# Patient Record
Sex: Female | Born: 1945 | Race: Black or African American | Hispanic: No | Smoking: Current every day smoker
Health system: Southern US, Community
[De-identification: ages and names within clinical notes are randomized; demographics above are authoritative.]

## PROBLEM LIST (undated history)

## (undated) DIAGNOSIS — I10 Essential (primary) hypertension: Secondary | ICD-10-CM

## (undated) DIAGNOSIS — H269 Unspecified cataract: Secondary | ICD-10-CM

## (undated) DIAGNOSIS — H35039 Hypertensive retinopathy, unspecified eye: Secondary | ICD-10-CM

## (undated) HISTORY — DX: Unspecified cataract: H26.9

## (undated) HISTORY — PX: TONSILLECTOMY: SUR1361

## (undated) HISTORY — PX: ABDOMINAL HYSTERECTOMY: SHX81

## (undated) HISTORY — DX: Hypertensive retinopathy, unspecified eye: H35.039

## (undated) HISTORY — PX: CYSTECTOMY: SUR359

---

## 2017-05-17 ENCOUNTER — Ambulatory Visit
Admit: 2017-05-17 | Discharge: 2017-05-17 | Payer: PRIVATE HEALTH INSURANCE | Attending: Family Medicine | Primary: Family Medicine

## 2017-05-17 DIAGNOSIS — I10 Essential (primary) hypertension: Secondary | ICD-10-CM

## 2017-05-17 MED ORDER — LISINOPRIL 20 MG TAB
20 mg | ORAL_TABLET | Freq: Every day | ORAL | 1 refills | Status: DC
Start: 2017-05-17 — End: 2017-08-17

## 2017-05-17 NOTE — Patient Instructions (Addendum)
Heart-Healthy Diet: Care Instructions  Your Care Instructions    A heart-healthy diet has lots of vegetables, fruits, nuts, beans, and whole grains, and is low in salt. It limits foods that are high in saturated fat, such as meats, cheeses, and fried foods. It may be hard to change your diet, but even small changes can lower your risk of heart attack and heart disease.  Follow-up care is a key part of your treatment and safety. Be sure to make and go to all appointments, and call your doctor if you are having problems. It's also a good idea to know your test results and keep a list of the medicines you take.  How can you care for yourself at home?  Watch your portions  ?? Learn what a serving is. A "serving" and a "portion" are not always the same thing. Make sure that you are not eating larger portions than are recommended. For example, a serving of pasta is ?? cup. A serving size of meat is 2 to 3 ounces. A 3-ounce serving is about the size of a deck of cards. Measure serving sizes until you are good at "eyeballing" them. Keep in mind that restaurants often serve portions that are 2 or 3 times the size of one serving.  ?? To keep your energy level up and keep you from feeling hungry, eat often but in smaller portions.  ?? Eat only the number of calories you need to stay at a healthy weight. If you need to lose weight, eat fewer calories than your body burns (through exercise and other physical activity).  Eat more fruits and vegetables  ?? Eat a variety of fruit and vegetables every day. Dark green, deep orange, red, or yellow fruits and vegetables are especially good for you. Examples include spinach, carrots, peaches, and berries.  ?? Keep carrots, celery, and other veggies handy for snacks. Buy fruit that is in season and store it where you can see it so that you will be tempted to eat it.  ?? Cook dishes that have a lot of veggies in them, such as stir-fries and soups.  Limit saturated and trans fat   ?? Read food labels, and try to avoid saturated and trans fats. They increase your risk of heart disease. Trans fat is found in many processed foods such as cookies and crackers.  ?? Use olive or canola oil when you cook. Try cholesterol-lowering spreads, such as Benecol or Take Control.  ?? Bake, broil, grill, or steam foods instead of frying them.  ?? Choose lean meats instead of high-fat meats such as hot dogs and sausages. Cut off all visible fat when you prepare meat.  ?? Eat fish, skinless poultry, and meat alternatives such as soy products instead of high-fat meats. Soy products, such as tofu, may be especially good for your heart.  ?? Choose low-fat or fat-free milk and dairy products.  Eat fish  ?? Eat at least two servings of fish a week. Certain fish, such as salmon and tuna, contain omega-3 fatty acids, which may help reduce your risk of heart attack.  Eat foods high in fiber  ?? Eat a variety of grain products every day. Include whole-grain foods that have lots of fiber and nutrients. Examples of whole-grain foods include oats, whole wheat bread, and brown rice.  ?? Buy whole-grain breads and cereals, instead of white bread or pastries.  Limit salt and sodium  ?? Limit how much salt and sodium you eat to help   lower your blood pressure.  ?? Taste food before you salt it. Add only a little salt when you think you need it. With time, your taste buds will adjust to less salt.  ?? Eat fewer snack items, fast foods, and other high-salt, processed foods. Check food labels for the amount of sodium in packaged foods.  ?? Choose low-sodium versions of canned goods (such as soups, vegetables, and beans).  Limit sugar  ?? Limit drinks and foods with added sugar. These include candy, desserts, and soda pop.  Limit alcohol  ?? Limit alcohol to no more than 2 drinks a day for men and 1 drink a day for women. Too much alcohol can cause health problems.  When should you call for help?   Watch closely for changes in your health, and be sure to contact your doctor if:  ?? ?? You would like help planning heart-healthy meals.   Where can you learn more?  Go to [employer].  Enter V137 in the search box to learn more about "Heart-Healthy Diet: Care Instructions."  Current as of: August 01, 2016  Content Version: 11.9  ?? 2006-2018 [employer]. Care instructions adapted under license by [employer] (which disclaims liability or warranty for this information). If you have questions about a medical condition or this instruction, always ask your healthcare professional. [employer] disclaims any warranty or liability for your use of this information.         Saline Nasal Washes: Care Instructions  Your Care Instructions  Saline nasal washes help keep the nasal passages open by washing out thick or dried mucus. This simple remedy can help relieve symptoms of allergies, sinusitis, and colds. It also can make the nose feel more comfortable by keeping the mucous membranes moist. You may notice a little burning sensation in your nose the first few times you use the solution, but this usually gets better in a few days.  Follow-up care is a key part of your treatment and safety. Be sure to make and go to all appointments, and call your doctor if you are having problems. It's also a good idea to know your test results and keep a list of the medicines you take.  How can you care for yourself at home?  ?? You can buy premixed saline solution in a squeeze bottle or other sinus rinse products at a drugstore. Read and follow the instructions on the label.  ?? You also can make your own saline solution by adding 1 teaspoon of salt and 1 teaspoon of baking soda to 2 cups of distilled water.  ?? If you use a homemade solution, pour a small amount into a clean bowl. Using a rubber bulb syringe, squeeze the syringe and place the tip in the  salt water. Pull a small amount of the salt water into the syringe by relaxing your hand.  ?? Sit down with your head tilted slightly back. Do not lie down. Put the tip of the bulb syringe or the squeeze bottle a little way into one of your nostrils. Gently drip or squirt a few drops into the nostril. Repeat with the other nostril. Some sneezing and gagging are normal at first.  ?? Gently blow your nose.  ?? Wipe the syringe or bottle tip clean after each use.  ?? Repeat this 2 or 3 times a day.  ?? Use nasal washes gently if you have nosebleeds often.  When should you call for help?  Watch closely for changes  in your health, and be sure to contact your doctor if:  ?? ?? You often get nosebleeds.   ?? ?? You have problems doing the nasal washes.   Where can you learn more?  Go to [employer].  Enter B784 in the search box to learn more about "Saline Nasal Washes: Care Instructions."  Current as of: April 06, 2016  Content Version: 11.9  ?? 2006-2018 [employer]. Care instructions adapted under license by [employer] (which disclaims liability or warranty for this information). If you have questions about a medical condition or this instruction, always ask your healthcare professional. [employer] disclaims any warranty or liability for your use of this information.

## 2017-05-17 NOTE — Progress Notes (Signed)
[facility]  _______________________________________  Laurie Marseilles, MD                 [address]        [place]. Laurie [place], MD                     [place], [place] [address]                                                                                    Phone: [phone]                                                                                    Fax: [phone]    Laurie Richard is a 72 y.o. female who is seen for evaluation of   Chief Complaint   Patient presents with   ??? Establish Care         HPI:     New pt here to establish care, she is here because her new insurer assigned her to me:    HTN: She is on Prinzide 20/12.5. She is not close to being controlled on this regimen. She states she is not taking the whole pill. Has been taking half a pill. He states she will see SBP in the 90s on the higher dose of  20/12.5. She makes her own food, does not use much salt at all. She states her BP is always high at the doctor's office. She states she was 137/94 after taking her medication this morning. She eats a lot of greens and stays away from red meat.     BP Readings from Last 3 Encounters:   05/17/17 (!) 164/102     Obesity: She was as heavy as #170, used to work on her feet 12-16 hours a day but is now retired, now is a Programmer, multimedia.   Wt Readings from Last 3 Encounters:   05/17/17 156 lb (70.8 kg)       HM:  Colo: She would like cologuard  Mammo: She states she has no family history of breast cancer in mother and sister      Review of Systems:  Review of Systems   Constitutional: Negative for chills and fever.   HENT: Negative for ear pain and hearing loss.    Eyes: Negative for blurred vision and double vision.   Respiratory: Negative for cough and shortness of breath.    Cardiovascular: Negative for chest pain and palpitations.   Gastrointestinal: Negative for abdominal pain, blood in stool, melena, nausea and vomiting.    Genitourinary: Negative for dysuria, frequency, hematuria and urgency.   Musculoskeletal: Negative for back pain and joint pain.   Skin: Negative for itching and rash.   Neurological: Negative for dizziness, tingling and headaches.   Psychiatric/Behavioral: Negative for depression and  suicidal ideas.       History:  No past medical history on file.    Past Surgical History:   Procedure Laterality Date   ??? HX ANKLE FRACTURE TX     ??? HX BILATERAL SALPINGO-OOPHORECTOMY     ??? HX HYSTERECTOMY     ??? HX TONSILLECTOMY         Family History   Problem Relation Age of Onset   ??? Hypertension Mother    ??? Heart Disease Mother    ??? No Known Problems Father    ??? Hypertension Sister        Social History     Tobacco Use   ??? Smoking status: Never Smoker   ??? Smokeless tobacco: Never Used   Substance Use Topics   ??? Alcohol use: Not Currently     Frequency: Never       Allergies   Allergen Reactions   ??? Opioids - Morphine Analogues Unknown (comments)     Had major altered state, felt like she was going to have a stroke       Vitals:  Visit Vitals  BP (!) 164/102   Ht 5' (1.524 m)   Wt 156 lb (70.8 kg)   BMI 30.47 kg/m??       Physical Exam:  Physical Exam   Constitutional: She is oriented to person, place, and time. She appears well-developed and well-nourished. No distress.   HENT:   Head: Normocephalic and atraumatic.   Mouth/Throat: Oropharynx is clear and moist.   Pale swollen nasal turbinates  +PND   Eyes: Pupils are equal, round, and reactive to light. Conjunctivae and EOM are normal.   Neck: Normal range of motion. Neck supple.   Cardiovascular: Normal rate, regular rhythm and normal heart sounds. Exam reveals no gallop and no friction rub.   No murmur heard.  Pulmonary/Chest: Effort normal and breath sounds normal. No respiratory distress. She has no wheezes. She has no rales.   Abdominal: Soft. Bowel sounds are normal. She exhibits no distension and no mass. There is no tenderness. There is no rebound and no guarding.    Musculoskeletal: Normal range of motion. She exhibits no edema or tenderness.   Neurological: She is alert and oriented to person, place, and time. She has normal reflexes. No cranial nerve deficit. Coordination normal.   Skin: Skin is warm and dry. No rash noted. She is not diaphoretic. No erythema.   Well healed scare medial L ankle   Psychiatric: She has a normal mood and affect. Her behavior is normal.   Nursing note and vitals reviewed.      Assessment and Plan:   1. Essential hypertension  She is very against diuretics. We will increase to  lisinopril only and then recehck her in a few weeks.   - lisinopril (PRINIVIL, ZESTRIL) 20 mg tablet; Take 1 Tab by mouth daily.  Dispense: 90 Tab; Refill: 1  - METABOLIC PANEL, COMPREHENSIVE  - TSH REFLEX TO T4    2. Screening mammogram, encounter for    - MAM 3D TOMO W MAMMO BI SCREENING INCL CAD; Future    3. Screen for colon cancer    - COLOGUARD TEST (FECAL DNA COLORECTAL CANCER SCREENING)    4. Allergic rhinitis, unspecified seasonality, unspecified trigger  Continue naturopathic remedies including Neti Pot.      5. Dysfunction of both eustachian tubes  As above, need better allergy control.     FU 4w for BP check and to recheck  kidneys      Roberts Gaudy, MD

## 2017-05-18 LAB — METABOLIC PANEL, COMPREHENSIVE
A-G Ratio: 1.5 (ref 1.2–2.2)
ALT (SGPT): 18 IU/L (ref 0–32)
AST (SGOT): 26 IU/L (ref 0–40)
Albumin: 4.3 g/dL (ref 3.5–4.8)
Alk. phosphatase: 90 IU/L (ref 39–117)
BUN/Creatinine ratio: 15 (ref 12–28)
BUN: 15 mg/dL (ref 8–27)
Bilirubin, total: 0.9 mg/dL (ref 0.0–1.2)
CO2: 25 mmol/L (ref 20–29)
Calcium: 9.6 mg/dL (ref 8.7–10.3)
Chloride: 99 mmol/L (ref 96–106)
Creatinine: 0.99 mg/dL (ref 0.57–1.00)
GFR est AA: 66 mL/min/{1.73_m2} (ref >59)
GFR est non-AA: 57 mL/min/{1.73_m2} — ABNORMAL LOW (ref >59)
GLOBULIN, TOTAL: 2.8 g/dL (ref 1.5–4.5)
Glucose: 73 mg/dL (ref 65–99)
Potassium: 4 mmol/L (ref 3.5–5.2)
Protein, total: 7.1 g/dL (ref 6.0–8.5)
Sodium: 142 mmol/L (ref 134–144)

## 2017-05-18 LAB — TSH REFLEX TO T4: TSH: 1.31 u[IU]/mL (ref 0.450–4.500)

## 2017-05-26 ENCOUNTER — Encounter: Payer: PRIVATE HEALTH INSURANCE | Attending: Family Medicine | Primary: Family Medicine

## 2017-05-27 ENCOUNTER — Ambulatory Visit
Admit: 2017-05-27 | Discharge: 2017-05-27 | Payer: PRIVATE HEALTH INSURANCE | Attending: Family Medicine | Primary: Family Medicine

## 2017-05-27 DIAGNOSIS — I10 Essential (primary) hypertension: Secondary | ICD-10-CM

## 2017-05-27 NOTE — Progress Notes (Signed)
[facility]  _______________________________________  Laurie Marseilles, MD                 [address]        [place], [place]                     [place], [place] [address]                                                                                    Phone: [phone]                                                                                    Fax: [phone]    Laurie Richard is a 72 y.o. female who is seen for evaluation of   Chief Complaint   Patient presents with   ??? Elevated Blood Pressure         HPI:     FU HTN:    We have her on lisinopril  a day. Her BP went up at home while she was dealing with dental abscess and the tooth was finally pulled yesterday. She states her BP has dropped significantly since having he tooth pulled. She state she got a reading of 134/88 at home last night.     BP Readings from Last 3 Encounters:   05/27/17 140/90   05/17/17 (!) 164/102     She has an Rx she was given after the tooth extraction for Ultram, she is asking what kind of medicine this is.     Wt Readings from Last 3 Encounters:   05/27/17 156 lb (70.8 kg)   05/17/17 156 lb (70.8 kg)     She asks me about going to the gym to workout.     Review of Systems:  Review of Systems   Constitutional: Negative for chills and fever.   Respiratory: Negative for cough and shortness of breath.    Cardiovascular: Negative for chest pain and palpitations.   Gastrointestinal: Negative for abdominal pain, blood in stool, melena, nausea and vomiting.   Genitourinary: Negative for hematuria.       History:  History reviewed. No pertinent past medical history.    Past Surgical History:   Procedure Laterality Date   ??? HX ANKLE FRACTURE TX     ??? HX BILATERAL SALPINGO-OOPHORECTOMY     ??? HX HYSTERECTOMY     ??? HX TONSILLECTOMY         Family History   Problem Relation Age of Onset   ??? Hypertension Mother    ??? Heart Disease Mother    ??? No Known Problems Father    ??? Hypertension Sister        Social History      Tobacco Use   ??? Smoking status: Never Smoker   ???  Smokeless tobacco: Never Used   Substance Use Topics   ??? Alcohol use: Not Currently     Frequency: Never       Allergies   Allergen Reactions   ??? Opioids - Morphine Analogues Unknown (comments)     Had major altered state, felt like she was going to have a stroke       Vitals:  Visit Vitals  BP 140/90   Ht 5' (1.524 m)   Wt 156 lb (70.8 kg)   BMI 30.47 kg/m??       Physical Exam:  Physical Exam   Constitutional: She is oriented to person, place, and time. She appears well-developed and well-nourished. No distress.   HENT:   Head: Normocephalic and atraumatic.   Nose: Nose normal.   Mouth/Throat: Oropharynx is clear and moist.   Clotted socket R lower jaw from recent tooth extraction   Eyes: Pupils are equal, round, and reactive to light. Conjunctivae and EOM are normal.   Neck: Normal range of motion. Neck supple.   Cardiovascular: Normal rate, regular rhythm and normal heart sounds. Exam reveals no gallop and no friction rub.   No murmur heard.  Pulmonary/Chest: Effort normal and breath sounds normal. No respiratory distress. She has no wheezes. She has no rales.   Abdominal: Soft. Bowel sounds are normal. She exhibits no distension and no mass. There is no tenderness. There is no rebound and no guarding.   Obese   Musculoskeletal: Normal range of motion. She exhibits no edema or tenderness.   Neurological: She is alert and oriented to person, place, and time. She has normal reflexes. No cranial nerve deficit. Coordination normal.   Skin: Skin is warm and dry. No rash noted. She is not diaphoretic. No erythema.   Psychiatric: She has a normal mood and affect. Her behavior is normal.   Nursing note and vitals reviewed.      Assessment and Plan:   1. Essential hypertension  Stable, continue current lisinopril and if BPs trend < 120/80, try coming off medicine to see if it is still needed to keep her < 135/90. She will call with any questions or concerns.      2. Class 1 obesity due to excess calories with body mass index (BMI) of 30.0 to 30.9 in adult, unspecified whether serious comorbidity present  Discussed need to increase aerobic exercise, reduce portion sizes. Recommended at least 30-40 min of aerobic exercise 4 days a week. Will continue to motivate.     3. Abscessed tooth  Resolved. I informed her Ultram was an opioid pain narcotic and she promptly tore up the Rx stating "I don't need this. I have no pain." I agree with her assessment.     FU 32m    Roberts Gaudy, MD

## 2017-06-14 ENCOUNTER — Encounter: Attending: Family Medicine | Primary: Family Medicine

## 2017-07-06 ENCOUNTER — Ambulatory Visit: Payer: PRIVATE HEALTH INSURANCE | Primary: Family Medicine

## 2017-08-17 ENCOUNTER — Encounter

## 2017-08-17 MED ORDER — LISINOPRIL 20 MG TAB
20 mg | ORAL_TABLET | Freq: Every day | ORAL | 1 refills | Status: DC
Start: 2017-08-17 — End: 2017-09-15

## 2017-08-29 ENCOUNTER — Encounter: Payer: PRIVATE HEALTH INSURANCE | Attending: Family Medicine | Primary: Family Medicine

## 2017-09-07 ENCOUNTER — Ambulatory Visit: Attending: Family Medicine | Primary: Family Medicine

## 2017-09-07 ENCOUNTER — Inpatient Hospital Stay
Admit: 2017-09-07 | Discharge: 2017-09-09 | Disposition: A | Discharge status: Home / Self Care | Payer: PRIVATE HEALTH INSURANCE | Attending: Internal Medicine | Admitting: Internal Medicine

## 2017-09-07 ENCOUNTER — Ambulatory Visit
Admit: 2017-09-07 | Discharge: 2017-09-07 | Payer: PRIVATE HEALTH INSURANCE | Attending: Family Medicine | Primary: Family Medicine

## 2017-09-07 ENCOUNTER — Inpatient Hospital Stay: Admit: 2017-09-07 | Payer: PRIVATE HEALTH INSURANCE | Primary: Family Medicine

## 2017-09-07 ENCOUNTER — Emergency Department: Admit: 2017-09-07 | Payer: PRIVATE HEALTH INSURANCE | Primary: Family Medicine

## 2017-09-07 DIAGNOSIS — I161 Hypertensive emergency: Secondary | ICD-10-CM

## 2017-09-07 DIAGNOSIS — I160 Hypertensive urgency: Principal | ICD-10-CM

## 2017-09-07 LAB — EKG, 12 LEAD, INITIAL
Atrial Rate: 70 {beats}/min
Calculated P Axis: 64 degrees
Calculated R Axis: 23 degrees
Calculated T Axis: 36 degrees
P-R Interval: 148 ms
Q-T Interval: 392 ms
QRS Duration: 66 ms
QTC Calculation (Bezet): 423 ms
Ventricular Rate: 70 {beats}/min

## 2017-09-07 LAB — METABOLIC PANEL, BASIC
Anion gap: 7 mmol/L (ref 7–16)
BUN: 13 MG/DL (ref 8–23)
CO2: 27 mmol/L (ref 21–32)
Calcium: 9.2 MG/DL (ref 8.3–10.4)
Chloride: 108 mmol/L — ABNORMAL HIGH (ref 98–107)
Creatinine: 1.13 MG/DL — ABNORMAL HIGH (ref 0.6–1.0)
GFR est AA: >60 mL/min/{1.73_m2} (ref >60)
GFR est non-AA: 50 mL/min/{1.73_m2} — ABNORMAL LOW (ref >60)
Glucose: 86 mg/dL (ref 65–100)
Potassium: 3.7 mmol/L (ref 3.5–5.1)
Sodium: 142 mmol/L (ref 136–145)

## 2017-09-07 LAB — CBC WITH AUTOMATED DIFF
ABS. BASOPHILS: 0.1 10*3/uL (ref 0.0–0.2)
ABS. EOSINOPHILS: 0.1 10*3/uL (ref 0.0–0.8)
ABS. IMM. GRANS.: 0 10*3/uL (ref 0.0–0.5)
ABS. LYMPHOCYTES: 2 10*3/uL (ref 0.5–4.6)
ABS. MONOCYTES: 0.3 10*3/uL (ref 0.1–1.3)
ABS. NEUTROPHILS: 2.1 10*3/uL (ref 1.7–8.2)
ABSOLUTE NRBC: 0 10*3/uL (ref 0.0–0.2)
BASOPHILS: 1 % (ref 0.0–2.0)
EOSINOPHILS: 3 % (ref 0.5–7.8)
HCT: 42.9 % (ref 35.8–46.3)
HGB: 13.6 g/dL (ref 11.7–15.4)
IMMATURE GRANULOCYTES: 0 % (ref 0.0–5.0)
LYMPHOCYTES: 44 % (ref 13–44)
MCH: 24.1 PG — ABNORMAL LOW (ref 26.1–32.9)
MCHC: 31.7 g/dL (ref 31.4–35.0)
MCV: 75.9 FL — ABNORMAL LOW (ref 79.6–97.8)
MONOCYTES: 6 % (ref 4.0–12.0)
MPV: 11.7 FL (ref 9.4–12.3)
NEUTROPHILS: 46 % (ref 43–78)
PLATELET: 171 10*3/uL (ref 150–450)
RBC: 5.65 M/uL — ABNORMAL HIGH (ref 4.05–5.2)
RDW: 16.5 % — ABNORMAL HIGH (ref 11.9–14.6)
WBC: 4.6 10*3/uL (ref 4.3–11.1)

## 2017-09-07 LAB — CBC WITH AUTO DIFFERENTIAL
Basophils %: 1 % (ref 0.0–2.0)
Basophils Absolute: 0.1 10*3/uL (ref 0.0–0.2)
Eosinophils %: 3 % (ref 0.5–7.8)
Eosinophils Absolute: 0.1 10*3/uL (ref 0.0–0.8)
Granulocyte Absolute Count: 0 10*3/uL (ref 0.0–0.5)
Hematocrit: 42.9 % (ref 35.8–46.3)
Hemoglobin: 13.6 g/dL (ref 11.7–15.4)
Immature Granulocytes: 0 % (ref 0.0–5.0)
Lymphocytes %: 44 % (ref 13–44)
Lymphocytes Absolute: 2 10*3/uL (ref 0.5–4.6)
MCH: 24.1 PG — ABNORMAL LOW (ref 26.1–32.9)
MCHC: 31.7 g/dL (ref 31.4–35.0)
MCV: 75.9 FL — ABNORMAL LOW (ref 79.6–97.8)
MPV: 11.7 FL (ref 9.4–12.3)
Monocytes %: 6 % (ref 4.0–12.0)
Monocytes Absolute: 0.3 10*3/uL (ref 0.1–1.3)
NRBC Absolute: 0 10*3/uL (ref 0.0–0.2)
Neutrophils %: 46 % (ref 43–78)
Neutrophils Absolute: 2.1 10*3/uL (ref 1.7–8.2)
Platelets: 171 10*3/uL (ref 150–450)
RBC: 5.65 M/uL — ABNORMAL HIGH (ref 4.05–5.2)
RDW: 16.5 % — ABNORMAL HIGH (ref 11.9–14.6)
WBC: 4.6 10*3/uL (ref 4.3–11.1)

## 2017-09-07 LAB — BASIC METABOLIC PANEL
Anion Gap: 7 mmol/L (ref 7–16)
BUN: 13 MG/DL (ref 8–23)
CO2: 27 mmol/L (ref 21–32)
Calcium: 9.2 MG/DL (ref 8.3–10.4)
Chloride: 108 mmol/L — ABNORMAL HIGH (ref 98–107)
Creatinine: 1.13 MG/DL — ABNORMAL HIGH (ref 0.6–1.0)
EGFR IF NonAfrican American: 50 mL/min/{1.73_m2} — ABNORMAL LOW (ref >60)
GFR African American: >60 mL/min/{1.73_m2} (ref >60)
Glucose: 86 mg/dL (ref 65–100)
Potassium: 3.7 mmol/L (ref 3.5–5.1)
Sodium: 142 mmol/L (ref 136–145)

## 2017-09-07 LAB — EKG 12-LEAD
Atrial Rate: 70 {beats}/min
P Axis: 64 degrees
P-R Interval: 148 ms
Q-T Interval: 392 ms
QRS Duration: 66 ms
QTc Calculation (Bazett): 423 ms
R Axis: 23 degrees
T Axis: 36 degrees
Ventricular Rate: 70 {beats}/min

## 2017-09-07 MED ORDER — SODIUM CHLORIDE 0.9 % IV
5 mg/mL | INTRAVENOUS | Status: DC
Start: 2017-09-07 — End: 2017-09-07

## 2017-09-07 MED ORDER — LISINOPRIL 20 MG TAB
20 mg | ORAL | Status: AC
Start: 2017-09-07 — End: 2017-09-07
  Administered 2017-09-07: 20:00:00 via ORAL

## 2017-09-07 MED ORDER — NICARDIPINE 25 MG/250 ML IN 0.9% NS INFUSION KIT (GVL)
25 MG/0 mL | INTRAVENOUS | Status: DC
Start: 2017-09-07 — End: 2017-09-07
  Administered 2017-09-07 (×2): via INTRAVENOUS

## 2017-09-07 MED ORDER — HYDRALAZINE 20 MG/ML IJ SOLN
20 mg/mL | Freq: Four times a day (QID) | INTRAMUSCULAR | Status: DC | PRN
Start: 2017-09-07 — End: 2017-09-07
  Administered 2017-09-07 – 2017-09-08 (×2): via INTRAVENOUS

## 2017-09-07 MED ORDER — LABETALOL 5 MG/ML IV SOLN
5 mg/mL | INTRAVENOUS | Status: AC
Start: 2017-09-07 — End: 2017-09-07
  Administered 2017-09-07: 18:00:00 via INTRAVENOUS

## 2017-09-07 MED ORDER — CLONIDINE 0.2 MG/24 HR WEEKLY TRANSDERM PATCH
0.2 mg/24 hr | TRANSDERMAL | Status: DC
Start: 2017-09-07 — End: 2017-09-07

## 2017-09-07 MED FILL — HYDRALAZINE 20 MG/ML IJ SOLN: 20 mg/mL | INTRAMUSCULAR | Qty: 1

## 2017-09-07 MED FILL — LABETALOL 5 MG/ML IV SOLN: 5 mg/mL | INTRAVENOUS | Qty: 20

## 2017-09-07 MED FILL — LABETALOL 5 MG/ML IV SOLN: 5 mg/mL | INTRAVENOUS | Qty: 60

## 2017-09-07 MED FILL — NICARDIPINE 25 MG/250 ML IN 0.9% NS INFUSION KIT (GVL): 25 MG/0 mL | INTRAVENOUS | Qty: 250

## 2017-09-07 MED FILL — CLONIDINE 0.2 MG/24 HR WEEKLY TRANSDERM PATCH: 0.2 mg/24 hr | TRANSDERMAL | Qty: 1

## 2017-09-07 MED FILL — LISINOPRIL 20 MG TAB: 20 mg | ORAL | Qty: 1

## 2017-09-07 NOTE — ED Notes (Signed)
Cardene drip stopped at this time related to BP of 154/78. BP cycling.

## 2017-09-07 NOTE — ED Notes (Signed)
Spoke with Dr. Gil Cruz about continued elevated Bp despite attempt at management. encouraged ICU admit after speaking with Dawn, house sup, who states she cannot give patient bed with elevated BP. Dr. Cruz states she will put in cardene order and ICU order.

## 2017-09-07 NOTE — ED Provider Notes (Addendum)
[facility]  Arrival Date/Time: No admission date for patient encounter.      Laurie Richard  MRN: [id number]    Date of Birth: Mar 21, 1945   72 y.o. female    [facility] EMERGENCY DEPT Room/bed info not found  Seen on 09/07/2017 @ 12:21 PM      [facility] Provider NOTE:   72 year old female brought in by EMS from primary care doctor's office where her blood pressure was noted to be 220/140.  Not having chest pain.  She was transported by EMS and received 20 mg of labetalol in route.  Repeat blood pressure after labetalol was 220/120.  1:04 PM  At this time, BP is 147/112 improved after prehospital labetalol.  She takes lisinopril 20 mg daily but she has been breaking the pills in half and taking a half in the morning and a half at night.  She states that she is recently stopped eating a healthy diet and thinks that may be contributing to her blood pressure being up.  She denies chest pain, shortness of breath, blurred vision or headache.  Paulita FujitaJay F Blankenship, MD; 09/07/2017 @12 :21 PM============================     Laurie Richard is a 72 y.o. female [facility] 09[facility]07/2017 [facility] 12[facility]     No chief complaint on file.    Per documentation by primary care physician patient was unable to complete the clock face drawing.  No slurred speech.  Blood pressure was high at that time.  He is concerned she may have CNS abnormality which is why he sent her here.  She is noncompliant with blood pressure medication which she admits to.  Currently she denies any headache, chest pain, slurred speech, difficulty moving, weakness tingling or numbness.  Denies any vision changes.  She stated "this is a wake-up call"            Historian:     REVIEW OF SYSTEMS     Review of Systems   Constitutional: Negative.    HENT: Negative.    Respiratory: Negative.    Cardiovascular: Negative.    Gastrointestinal: Negative.    Genitourinary: Negative.    Musculoskeletal: Negative.    Neurological: Negative.     Hematological: Negative.    Psychiatric/Behavioral: Negative.        PAST MEDICAL HISTORY     No past medical history on file.  Past Surgical History:   Procedure Laterality Date   ??? HX ANKLE FRACTURE TX     ??? HX BILATERAL SALPINGO-OOPHORECTOMY     ??? HX HYSTERECTOMY     ??? HX TONSILLECTOMY       Social History     Socioeconomic History   ??? Marital status: WIDOWED     Spouse name: Not on file   ??? Number of children: Not on file   ??? Years of education: Not on file   ??? Highest education level: Not on file   Tobacco Use   ??? Smoking status: Never Smoker   ??? Smokeless tobacco: Never Used   Substance and Sexual Activity   ??? Alcohol use: Not Currently     Frequency: Never   ??? Drug use: Never   ??? Sexual activity: Not Currently     Partners: Male     Cannot display prior to admission medications because the patient has not been admitted in this contact.     Allergies   Allergen Reactions   ??? Opioids - Morphine Analogues Unknown (comments)  Had major altered state, felt like she was going to have a stroke        PHYSICAL EXAM       There were no vitals filed for this visit. Vital signs were reviewed.     Physical Exam   Constitutional: She is oriented to person, place, and time. She appears well-developed and well-nourished. No distress.   HENT:   Head: Normocephalic and atraumatic.   Eyes: Pupils are equal, round, and reactive to light. Conjunctivae and EOM are normal.   Neck: Normal range of motion. Neck supple.   Cardiovascular: Normal rate and regular rhythm.   Pulmonary/Chest: Effort normal and breath sounds normal. No stridor. No respiratory distress. She has no wheezes.   Abdominal: Soft. Bowel sounds are normal. She exhibits no distension and no mass. There is no tenderness. There is no rebound and no guarding.   Musculoskeletal: Normal range of motion. She exhibits no edema or deformity.   Neurological: She is alert and oriented to person, place, and time. She  displays normal reflexes. No cranial nerve deficit or sensory deficit. She exhibits normal muscle tone. Coordination normal.   No pronator drift.  Normal finger-nose-finger.  Normal identification.  Normal speech.  Normal memory recall   Skin: Skin is warm and dry. Capillary refill takes less than 2 seconds. She is not diaphoretic. No erythema. No pallor.   Psychiatric: She has a normal mood and affect. Her behavior is normal. Thought content normal.        MEDICAL DECISION MAKING     Differential Diagnosis:     MDM  Number of Diagnoses or Management Options  Hypertensive urgency:   Diagnosis management comments: EKG rate of 70 normal sinus rhythm with PVCs.  No STEMI or ischemic changes noted.  Normal axis.  Normal QRS interval.  She is asymptomatic at this time however her primary care physician documented difficulty with drawing the clock face and he is concerned for CNS abnormalities.  She has no facial droop, cranial nerve completely intact she has no difficulty speaking full gamut of neurologic testing here by myself is negative.  Blood pressure still elevated.  Will give labetalol.  Will obtain CT scan of the head.      3:37 PM  CT had negative.  After 20 labetalol blood pressure improved slightly however slowly crept back up.  She had neurologic deficit for primary care physician however none for me at this time.  Concern for hypertensive urgency.  She is noncompliant with blood pressure medication.  We will give her home medication of labetalol.  Will give labetalol drip since she stated she feels slightly lightheaded still and is worsened with positional changes.  I recommend admission for better blood pressure control.  She is in agreement with the plan.  She will be admitted to the hospitalist service.    CT Results  (Last 48 hours)               09/07/17 1455  CT HEAD WO CONT Final result    Impression:  Impression: Mild chronic small vessel ischemic changes.                        Narrative:  CT head without contrast       History: hypertension and ? Abnormal CNS finding by PCP. Episode of   bladder   incontinence.        Technique: 5mm axial images were obtained from the skull base  to the   vertex   without intravenous contrast.  Radiation dose reduction techniques were   used for   this study:  Our CT scanners use one or all of the following: Automated   exposure   control, adjustment of the mA and/or kVp according to patient's size,   iterative   reconstruction.       Comparison: None       Findings: The ventricles and sulci are normal in size and configuration.   There   are no extra-axial fluid collections. There is no evidence to suggest an   acute   major territorial infarct. There is no evidence of acute intraparenchymal   hemorrhage or mass effect. Patchy areas of decreased attenuation within   the   supratentorial periventricular white matter would be most compatible with   mild   chronic small vessel ischemic change. The bony calvarium is intact. The   visualized mastoid air cells and paranasal sinuses are well pneumatized   and   aerated.             Recent Results (from the past 12 hour(s))  -CBC WITH AUTOMATED DIFF  Collection Time: 09/07/17  1:07 PM       Result                      Value             Ref Range           WBC                         4.6               4.3 - 11.1 K*       RBC                         5.65 (H)          4.05 - 5.2 M*       HGB                         13.6              11.7 - 15.4 *       HCT                         42.9              35.8 - 46.3 %       MCV                         75.9 (L)          79.6 - 97.8 *       MCH                         24.1 (L)          26.1 - 32.9 *       MCHC                        31.7              31.4 - 35.0 *       RDW  16.5 (H)          11.9 - 14.6 %       PLATELET                    171               150 - 450 K/*       MPV                         11.7              9.4 - 12.3 FL        ABSOLUTE NRBC               0.00              0.0 - 0.2 K/*       DF                          AUTOMATED                             NEUTROPHILS                 46                43 - 78 %           LYMPHOCYTES                 44                13 - 44 %           MONOCYTES                   6                 4.0 - 12.0 %        EOSINOPHILS                 3                 0.5 - 7.8 %         BASOPHILS                   1                 0.0 - 2.0 %         IMMATURE GRANULOCYTES       0                 0.0 - 5.0 %         ABS. NEUTROPHILS            2.1               1.7 - 8.2 K/*       ABS. LYMPHOCYTES            2.0               0.5 - 4.6 K/*       ABS. MONOCYTES              0.3               0.1 - 1.3 K/*       ABS.  EOSINOPHILS            0.1               0.0 - 0.8 K/*       ABS. BASOPHILS              0.1               0.0 - 0.2 K/*       ABS. IMM. GRANS.            0.0               0.0 - 0.5 K/*  -METABOLIC PANEL, BASIC  Collection Time: 09/07/17  1:07 PM       Result                      Value             Ref Range           Sodium                      142               136 - 145 mm*       Potassium                   3.7               3.5 - 5.1 mm*       Chloride                    108 (H)           98 - 107 mmo*       CO2                         27                21 - 32 mmol*       Anion gap                   7                 7 - 16 mmol/L       Glucose                     86                65 - 100 mg/*       BUN                         13                8 - 23 MG/DL        Creatinine                  1.13 (H)          0.6 - 1.0 MG*       GFR est AA                  >60               >60 ml/min/1*       GFR est non-AA  50 (L)            >60 ml/min/1*       Calcium                     9.2               8.3 - 10.4 M*  -EKG, 12 LEAD, INITIAL  Collection Time: 09/07/17  1:52 PM       Result                      Value             Ref Range            Ventricular Rate            70                BPM                 Atrial Rate                 70                BPM                 P-R Interval                148               ms                  QRS Duration                66                ms                  Q-T Interval                392               ms                  QTC Calculation (Bezet)     423               ms                  Calculated P Axis           64                degrees             Calculated R Axis           23                degrees             Calculated T Axis           36                degrees             Diagnosis                                                     !! AGE AND GENDER SPECIFIC ECG  ANALYSIS !! Sinus rhythm with occasional Premature ventricular complexes Possible Left atrial enlargement Borderline ECG No previous ECGs available          Amount and/or Complexity of Data Reviewed  Clinical lab tests: ordered and reviewed  Tests in the radiology section of CPT??: ordered and reviewed  Tests in the medicine section of CPT??: ordered and reviewed      Procedures    ED Course:      Disposition:    Diagnosis:

## 2017-09-07 NOTE — ED Triage Notes (Addendum)
Pt arrives via EMS from PCP with c/o HTN 220/140. Denies cp. Other VSS in route. EKG unremarkable in route and at PCP. Dr. Blankenship. Pt had IC episode of bladder in route and able to change into paper scrubs. Pt states she is taking BP meds but she fell off of her diet and hasn't been eating the food she should. Pt denies cp, SOB, HA, and vision changes.

## 2017-09-07 NOTE — ED Notes (Signed)
Spoke with Dr. Joellyn Haff about continued elevated Bp despite attempt at management. encouraged ICU admit after speaking with Dawn, house sup, who states she cannot give patient bed with elevated BP. Dr. Sondra Come states she will put in cardene order and ICU order.

## 2017-09-07 NOTE — ED Notes (Signed)
Pt arrives via EMS from PCP with c/o HTN 220/140. Denies cp. Other VSS in route. EKG unremarkable in route and at PCP. Dr. Malen GauzeBlankenship. Pt had IC episode of bladder in route and able to change into paper scrubs. Pt states she is taking BP meds but she fell off of her diet and hasn't been eating the food she should. Pt denies cp, SOB, HA, and vision changes.

## 2017-09-07 NOTE — H&P (Signed)
Hospitalist Admission History and Physical     CHIEF COMPLAINT:  Hypertensive Urgency    Subjective:   Patient is an alert, oriented 72 y.o. African American female who presents to ER this afternoon from [facility] office where she was found with blood pressure of 220/140.  No chest pain, headache, visual change, neuro symptoms.  This is not a new problem, old records review indicates she has been seen multiple times, mostly in the ER,  since at least 2014 for the same issue with extensive history of noncompliance.  She informs me she just needs a little lemon juice for her blood pressure.  Does not listen to reasons.  Administered labetalol, IVP, hydralazine IV, oral lisinopril and clonidine patch 0.2 mg without subsequent lowering of blood pressure.  Seems perplexed at blood pressure resistance. Dr Joellyn Haff in for exam, will  Have to begin Cardene drip and admit to critical care unit for closer monitoring.  Of note, PHP note from earlier today indicates patient was unable to draw the face of a clock correctly.  May have early dementia, needs complete dementia workup on discharge. Additional history below.  Denies chest pain or SOB.  Noted frequent ventricular beats and intermittent bigeminy.  Reports recent palpitations, has not mentioned to PHP.  Denies syncope or near syncope or feeling as if heart is stopping.  Denies past cardiology workup.    Past Medical History:   Diagnosis Date   ??? Hypertension 50'S ?    PATIENT HAS BEEN NONCOMPLAINT WITH ANTIHYPERTENSIVE MEDS SINCE AT LEAST 2015   ??? Noncompliance         Past Surgical History:   Procedure Laterality Date   ??? HX ANKLE FRACTURE TX  2019    LEFT ANKLE FRACTURE WITH ORIF   ??? HX BILATERAL SALPINGO-OOPHORECTOMY     ??? HX HYSTERECTOMY     ??? HX TONSILLECTOMY        Family History   Problem Relation Age of Onset   ??? Hypertension Mother         AGE 62   ??? Heart Disease Mother    ??? Cancer Father         PROSTATE.  DIED AT 28   ??? Hypertension Sister         AND DIABETES    ??? Hypertension Brother    ??? Other Brother         DIED AT 15. MVA   ??? Drug Abuse Brother         DIED AT 27       Social History     Social History Narrative    09/07/17:  PATIENT IS WIDOWED.  LIVES ALONE.  WORKS PART TIME AS A CNA.  HAS ONE DAUGHTER, 2 SONS LIVING IN [place]        Social History     Substance and Sexual Activity   Drug Use Never       Allergies   Allergen Reactions   ??? Opioids - Morphine Analogues Unknown (comments)     Had major altered state, felt like she was going to have a stroke   ??? Peanut Other (comments)     HEADACHE       Prior to Admission medications    Medication Sig Start Date End Date Taking? Authorizing Provider   lisinopril (PRINIVIL, ZESTRIL) 20 mg tablet Take 1 Tab by mouth daily. 08/17/17   Roberts Gaudy, MD     PHP:  Roberts Gaudy, MD  Review of Systems  A comprehensive review of systems was negative except for that written in the HPI.    Objective:     Visit Vitals  BP (!) 204/95   Pulse 69   Temp 98 ??F (36.7 ??C)   Resp 18   Ht 5' (1.524 m)   Wt 71.7 kg (158 lb)   SpO2 99%   BMI 30.86 kg/m??      Data Review:   Recent Results (from the past 24 hour(s))   CBC WITH AUTOMATED DIFF    Collection Time: 09/07/17  1:07 PM   Result Value Ref Range    WBC 4.6 4.3 - 11.1 K/uL    RBC 5.65 (H) 4.05 - 5.2 M/uL    HGB 13.6 11.7 - 15.4 g/dL    HCT 16.142.9 09.635.8 - 04.546.3 %    MCV 75.9 (L) 79.6 - 97.8 FL    MCH 24.1 (L) 26.1 - 32.9 PG    MCHC 31.7 31.4 - 35.0 g/dL    RDW 40.916.5 (H) 81.111.9 - 14.6 %    PLATELET 171 150 - 450 K/uL    MPV 11.7 9.4 - 12.3 FL    ABSOLUTE NRBC 0.00 0.0 - 0.2 K/uL    DF AUTOMATED      NEUTROPHILS 46 43 - 78 %    LYMPHOCYTES 44 13 - 44 %    MONOCYTES 6 4.0 - 12.0 %    EOSINOPHILS 3 0.5 - 7.8 %    BASOPHILS 1 0.0 - 2.0 %    IMMATURE GRANULOCYTES 0 0.0 - 5.0 %    ABS. NEUTROPHILS 2.1 1.7 - 8.2 K/UL    ABS. LYMPHOCYTES 2.0 0.5 - 4.6 K/UL    ABS. MONOCYTES 0.3 0.1 - 1.3 K/UL    ABS. EOSINOPHILS 0.1 0.0 - 0.8 K/UL    ABS. BASOPHILS 0.1 0.0 - 0.2 K/UL     ABS. IMM. GRANS. 0.0 0.0 - 0.5 K/UL   METABOLIC PANEL, BASIC    Collection Time: 09/07/17  1:07 PM   Result Value Ref Range    Sodium 142 136 - 145 mmol/L    Potassium 3.7 3.5 - 5.1 mmol/L    Chloride 108 (H) 98 - 107 mmol/L    CO2 27 21 - 32 mmol/L    Anion gap 7 7 - 16 mmol/L    Glucose 86 65 - 100 mg/dL    BUN 13 8 - 23 MG/DL    Creatinine 9.141.13 (H) 0.6 - 1.0 MG/DL    GFR est AA >78>60 >29>60 FA/OZH/0.86V7ml/min/1.73m2    GFR est non-AA 50 (L) >60 ml/min/1.6473m2    Calcium 9.2 8.3 - 10.4 MG/DL   EKG, 12 LEAD, INITIAL    Collection Time: 09/07/17  1:52 PM   Result Value Ref Range    Ventricular Rate 70 BPM    Atrial Rate 70 BPM    P-R Interval 148 ms    QRS Duration 66 ms    Q-T Interval 392 ms    QTC Calculation (Bezet) 423 ms    Calculated P Axis 64 degrees    Calculated R Axis 23 degrees    Calculated T Axis 36 degrees    Diagnosis       !! AGE AND GENDER SPECIFIC ECG ANALYSIS !!  Sinus rhythm with occasional Premature ventricular complexes  Possible Left atrial enlargement  Borderline ECG  No previous ECGs available  Confirmed by BITTRICK  MD (UC), Karie KirksJON M ([id number]) on 09/07/2017 4:39:07 PM  8/28:  CXR:  Unremarkable portable chest radiograph  ??  8/28:  CT HEAD: Mild chronic small vessel ischemic changes.    Old records reviewed.     PHYSICAL EXAM:  General appearance: Oriented to time, place, person.  Very poor historian.  Does not accept medical advice readily, questions treatment and tests.  Alert, not listening to provider.  No acute distress, no chest pain, no SOB.  Head: Normocephalic, without obvious abnormality, atraumatic  Eyes: conjunctivae/corneas clear. PERRL, EOM's grossly intact.   Throat: Lips, mucosa, and tongue normal. Teeth and gums normal  Neck: supple, symmetrical, trachea midline, thyroid: not enlarged, symmetric, no tenderness/mass/nodules, no carotid bruit and no JVD  Lungs: clear to auscultation bilaterally  Heart: regular rate and rhythm with PVCs and bigeminy, S1, S2 normal, no  murmur, click, rub or gallop  Abdomen: soft, non-tender. Bowel sounds normal. No masses,  no organomegaly  Extremities: extremities normal, atraumatic, no cyanosis or edema  Skin: Skin color, texture, turgor normal. No rashes or lesions  Neurologic: Grossly normal    Assessment/Plan:   Hypertensive urgency/Hypertensive crisis not responsive to multiple meds   Begin cardene drip, will have to go to closed critical  unit per protocol   ASA 81 daily   Renal ultrasound   Check lipids, A1C   Monitor vitals closely   Renal ultrasound and Echocardiogram pending    Suspect high degree of non compliance    Begin catapres patch 0.2 mg/24 hours   Prn apresoline   VS q 4 hours  Medically noncompliant x many years with resistence to change.   Ventricular ectopics:  PVCS WITH BIGEMINY NOTED  Home med reviewed and reconciled by me  AM labs:  TSH, urinalysis, lipid panel  2 D echo  Unsteady gait   PT/OT/CM consults   PPD    FULL CODE  LOVENOX FOR DVT PROPHY  HOME MEDS REVIEWED AND RECONCILED BY ME  Signed By: Leward Quan, NP     September 07, 2017

## 2017-09-07 NOTE — Progress Notes (Signed)
This is an Initial Medicare Annual Wellness Exam (AWV) (Performed 12 months after IPPE or effective date of Medicare Part B enrollment, Once in a lifetime)    I have reviewed the patient's medical history in detail and updated the computerized patient record.     HTN: Pt was seen here 3 months ago with plan for 2 week FU on BP, she returns today. BP is not audible to my MA who then used the machine and BP was very high:    BP Readings from Last 3 Encounters:   09/07/17 (!) 210/131   05/27/17 140/90   05/17/17 (!) 164/102     By auscultation I get 210/140 BP.     She states she has only been taking 1/2 pill a day of her lisinopril because she states her BP will get "too low." When I ask her how often she checks her BP outside this office, she states she does not.     She is supposed to be on 26m of lisinopril. This was after she refused to take HCTZ.     She scores poorly on a minicog today, she recalled 2/3 words and her clock numbers were off.     Lab Results   Component Value Date/Time    Sodium 142 05/17/2017 03:29 PM    Potassium 4.0 05/17/2017 03:29 PM    Chloride 99 05/17/2017 03:29 PM    CO2 25 05/17/2017 03:29 PM    Glucose 73 05/17/2017 03:29 PM    BUN 15 05/17/2017 03:29 PM    Creatinine 0.99 05/17/2017 03:29 PM    BUN/Creatinine ratio 15 05/17/2017 03:29 PM    GFR est AA 66 05/17/2017 03:29 PM    GFR est non-AA 57 (L) 05/17/2017 03:29 PM    Calcium 9.6 05/17/2017 03:29 PM    Bilirubin, total 0.9 05/17/2017 03:29 PM    AST (SGOT) 26 05/17/2017 03:29 PM    Alk. phosphatase 90 05/17/2017 03:29 PM    Protein, total 7.1 05/17/2017 03:29 PM    Albumin 4.3 05/17/2017 03:29 PM    A-G Ratio 1.5 05/17/2017 03:29 PM    ALT (SGPT) 18 05/17/2017 03:29 PM     Lab Results   Component Value Date/Time    TSH 1.310 05/17/2017 03:29 PM           HM:  Cologuard was ordered, she did not do this.   Mammo: Also ordered in may, she did not do this.   PNA shots should be up to date.     History   History reviewed. No pertinent  past medical history.   Past Surgical History:   Procedure Laterality Date   ??? HX ANKLE FRACTURE TX     ??? HX BILATERAL SALPINGO-OOPHORECTOMY     ??? HX HYSTERECTOMY     ??? HX TONSILLECTOMY       Current Outpatient Medications   Medication Sig Dispense Refill   ??? lisinopril (PRINIVIL, ZESTRIL) 20 mg tablet Take 1 Tab by mouth daily. 90 Tab 1     Allergies   Allergen Reactions   ??? Opioids - Morphine Analogues Unknown (comments)     Had major altered state, felt like she was going to have a stroke     Family History   Problem Relation Age of Onset   ??? Hypertension Mother    ??? Heart Disease Mother    ??? No Known Problems Father    ??? Hypertension Sister      Social History  Tobacco Use   ??? Smoking status: Never Smoker   ??? Smokeless tobacco: Never Used   Substance Use Topics   ??? Alcohol use: Not Currently     Frequency: Never     Patient Active Problem List   Diagnosis Code   ??? Essential hypertension I10   ??? Screening mammogram, encounter for Z12.31   ??? Screen for colon cancer Z12.11   ??? Allergic rhinitis J30.9   ??? Dysfunction of both eustachian tubes H69.83   ??? Class 1 obesity due to excess calories with body mass index (BMI) of 30.0 to 30.9 in adult E66.09, Z68.30   ??? Abscessed tooth K04.7   ??? Hypertensive emergency I16.1   ??? Routine general medical examination at a health care facility Z00.00   ??? Medically noncompliant Z91.19       Depression Risk Factor Screening:     3 most recent PHQ Screens 09/07/2017   Little interest or pleasure in doing things Not at all   Feeling down, depressed, irritable, or hopeless Not at all   Total Score PHQ 2 0     Alcohol Risk Factor Screening:   You do not drink alcohol or very rarely.    Functional Ability and Level of Safety:     Hearing Loss  Hearing is good.    Activities of Daily Living  The home contains: no safety equipment.  Patient does total self care    Fall Risk  Fall Risk Assessment, last 12 mths 09/07/2017   Able to walk? Yes   Fall in past 12 months? No       Abuse  Screen  Patient is not abused    Cognitive Screening   Evaluation of Cognitive Function:  Has your family/caregiver stated any concerns about your memory: no  Normal    Patient Care Team   Patient Care Team:  Myalee Stengel, Marti Sleigh, MD as PCP - General (Family Practice)    Review of Systems   Constitutional: Negative for chills and fever.   Respiratory: Negative for cough and shortness of breath.    Cardiovascular: Negative for chest pain and palpitations.   Gastrointestinal: Negative for abdominal pain, blood in stool, melena, nausea and vomiting.   Genitourinary: Negative for hematuria.   Neurological: Negative for speech change, focal weakness, seizures, loss of consciousness, weakness and headaches.     Physical Exam   Constitutional: She is oriented to person, place, and time. She appears well-developed and well-nourished. No distress.   HENT:   Head: Normocephalic and atraumatic.   Nose: Nose normal.   Mouth/Throat: Oropharynx is clear and moist.   Eyes: Pupils are equal, round, and reactive to light. Conjunctivae and EOM are normal.   Neck: Normal range of motion. Neck supple.   Cardiovascular: Normal rate, regular rhythm and normal heart sounds. Exam reveals no gallop and no friction rub.   No murmur heard.  Pulses are strong   Pulmonary/Chest: Effort normal and breath sounds normal. No respiratory distress. She has no wheezes. She has no rales.   Abdominal: Soft. Bowel sounds are normal. She exhibits no distension and no mass. There is no tenderness. There is no rebound and no guarding.   Obese   Musculoskeletal: Normal range of motion. She exhibits no edema or tenderness.   Neurological: She is alert and oriented to person, place, and time. She has normal reflexes. No cranial nerve deficit. Coordination normal.   She is AxOx3 but her clock drawing was off today and only had  2/3 recall. This is not normal for her.    Skin: Skin is warm and dry. No rash noted. She is not diaphoretic. No erythema.   Psychiatric: She  has a normal mood and affect. Her behavior is normal.   Nursing note and vitals reviewed.        Assessment/Plan   Education and counseling provided:  Are appropriate based on today's review and evaluation    1. Hypertensive emergency  BP is dangerously high and she is showing signs of neurologic difficulty today. We have called EMS and she will be brought to the [facility]. I do not think she would go if I allowed her to go by private vehicle because she has demonstrated medical noncompliance on multiple fronts since coming here. There are psychosocial reasons for this, and I empathize, but her risk of stroke is pretty high and she is already showing some signs of CNS difficulty. Appreciate the ER seeing her, I will see her in FU and will likely get cardiology involved if they haven't seen her by that point already.   - AMB POC EKG ROUTINE W/ 12 LEADS, INTER & REP    2. Routine general medical examination at a health care facility  She did not send cologuard back. Did not call back to schedule her mammogram. Will need a flu shot. Will reinforce these when I see her back.     3. Medically noncompliant  This is the root of her issues. She thinks she knows how to dose her own medications. I do not know if I will be able to break through this and get her on board with treatment plans. We talked about this while waiting for EMS. She states she is willing to see me in close FU and start chipping away at her health maintenance and BP issues.      FU pending hospital dispo    Health Maintenance Due   Topic Date Due   ??? Hepatitis C Screening  26-Jun-1945   ??? DTaP/Tdap/Td series (1 - Tdap) 10/27/1965   ??? Shingrix Vaccine Age 11> (1 of 2) 10/28/1994   ??? BREAST CANCER SCRN MAMMOGRAM  10/28/1994   ??? FOBT Q 1 YEAR AGE 51-75  10/28/1994   ??? GLAUCOMA SCREENING Q2Y  10/27/2009   ??? Bone Densitometry (Dexa) Screening  10/27/2009   ??? MEDICARE YEARLY EXAM  05/17/2017   ??? Influenza Age 65 to Adult  08/11/2017

## 2017-09-07 NOTE — ED Notes (Signed)
Noreene Larsson with hospitalist service at bedside

## 2017-09-07 NOTE — ED Provider Notes (Signed)
[facility]  Arrival Date/Time: No admission date for patient encounter.      Laurie Richard  MRN: [id number]    Date of Birth: Mar 21, 1945   72 y.o. female    [facility] EMERGENCY DEPT Room/bed info not found  Seen on 09/07/2017 @ 12:21 PM      [facility] Provider NOTE:   72 year old female brought in by EMS from primary care doctor's office where her blood pressure was noted to be 220/140.  Not having chest pain.  She was transported by EMS and received 20 mg of labetalol in route.  Repeat blood pressure after labetalol was 220/120.  1:04 PM  At this time, BP is 147/112 improved after prehospital labetalol.  She takes lisinopril 20 mg daily but she has been breaking the pills in half and taking a half in the morning and a half at night.  She states that she is recently stopped eating a healthy diet and thinks that may be contributing to her blood pressure being up.  She denies chest pain, shortness of breath, blurred vision or headache.  Paulita FujitaJay F Blankenship, MD; 09/07/2017 @12 :21 PM============================     Laurie Richard is a 72 y.o. female [facility] 09[facility]07/2017 [facility] 12[facility]     No chief complaint on file.    Per documentation by primary care physician patient was unable to complete the clock face drawing.  No slurred speech.  Blood pressure was high at that time.  He is concerned she may have CNS abnormality which is why he sent her here.  She is noncompliant with blood pressure medication which she admits to.  Currently she denies any headache, chest pain, slurred speech, difficulty moving, weakness tingling or numbness.  Denies any vision changes.  She stated "this is a wake-up call"            Historian:     REVIEW OF SYSTEMS     Review of Systems   Constitutional: Negative.    HENT: Negative.    Respiratory: Negative.    Cardiovascular: Negative.    Gastrointestinal: Negative.    Genitourinary: Negative.    Musculoskeletal: Negative.    Neurological: Negative.     Hematological: Negative.    Psychiatric/Behavioral: Negative.        PAST MEDICAL HISTORY     No past medical history on file.  Past Surgical History:   Procedure Laterality Date   ??? HX ANKLE FRACTURE TX     ??? HX BILATERAL SALPINGO-OOPHORECTOMY     ??? HX HYSTERECTOMY     ??? HX TONSILLECTOMY       Social History     Socioeconomic History   ??? Marital status: WIDOWED     Spouse name: Not on file   ??? Number of children: Not on file   ??? Years of education: Not on file   ??? Highest education level: Not on file   Tobacco Use   ??? Smoking status: Never Smoker   ??? Smokeless tobacco: Never Used   Substance and Sexual Activity   ??? Alcohol use: Not Currently     Frequency: Never   ??? Drug use: Never   ??? Sexual activity: Not Currently     Partners: Male     Cannot display prior to admission medications because the patient has not been admitted in this contact.     Allergies   Allergen Reactions   ??? Opioids - Morphine Analogues Unknown (comments)  Had major altered state, felt like she was going to have a stroke        PHYSICAL EXAM       There were no vitals filed for this visit. Vital signs were reviewed.     Physical Exam   Constitutional: She is oriented to person, place, and time. She appears well-developed and well-nourished. No distress.   HENT:   Head: Normocephalic and atraumatic.   Eyes: Pupils are equal, round, and reactive to light. Conjunctivae and EOM are normal.   Neck: Normal range of motion. Neck supple.   Cardiovascular: Normal rate and regular rhythm.   Pulmonary/Chest: Effort normal and breath sounds normal. No stridor. No respiratory distress. She has no wheezes.   Abdominal: Soft. Bowel sounds are normal. She exhibits no distension and no mass. There is no tenderness. There is no rebound and no guarding.   Musculoskeletal: Normal range of motion. She exhibits no edema or deformity.   Neurological: She is alert and oriented to person, place, and time. She displays normal reflexes. No cranial nerve deficit or  sensory deficit. She exhibits normal muscle tone. Coordination normal.   No pronator drift.  Normal finger-nose-finger.  Normal identification.  Normal speech.  Normal memory recall   Skin: Skin is warm and dry. Capillary refill takes less than 2 seconds. She is not diaphoretic. No erythema. No pallor.   Psychiatric: She has a normal mood and affect. Her behavior is normal. Thought content normal.        MEDICAL DECISION MAKING     Differential Diagnosis:     MDM  Number of Diagnoses or Management Options  Hypertensive urgency:   Diagnosis management comments: EKG rate of 70 normal sinus rhythm with PVCs.  No STEMI or ischemic changes noted.  Normal axis.  Normal QRS interval.  She is asymptomatic at this time however her primary care physician documented difficulty with drawing the clock face and he is concerned for CNS abnormalities.  She has no facial droop, cranial nerve completely intact she has no difficulty speaking full gamut of neurologic testing here by myself is negative.  Blood pressure still elevated.  Will give labetalol.  Will obtain CT scan of the head.      3:37 PM  CT had negative.  After 20 labetalol blood pressure improved slightly however slowly crept back up.  She had neurologic deficit for primary care physician however none for me at this time.  Concern for hypertensive urgency.  She is noncompliant with blood pressure medication.  We will give her home medication of labetalol.  Will give labetalol drip since she stated she feels slightly lightheaded still and is worsened with positional changes.  I recommend admission for better blood pressure control.  She is in agreement with the plan.  She will be admitted to the hospitalist service.    CT Results  (Last 48 hours)               09/07/17 1455  CT HEAD WO CONT Final result    Impression:  Impression: Mild chronic small vessel ischemic changes.                       Narrative:  CT head without contrast       History: hypertension and ?  Abnormal CNS finding by PCP. Episode of   bladder   incontinence.        Technique: 5mm axial images were obtained from the skull base  to the   vertex   without intravenous contrast.  Radiation dose reduction techniques were   used for   this study:  Our CT scanners use one or all of the following: Automated   exposure   control, adjustment of the mA and/or kVp according to patient's size,   iterative   reconstruction.       Comparison: None       Findings: The ventricles and sulci are normal in size and configuration.   There   are no extra-axial fluid collections. There is no evidence to suggest an   acute   major territorial infarct. There is no evidence of acute intraparenchymal   hemorrhage or mass effect. Patchy areas of decreased attenuation within   the   supratentorial periventricular white matter would be most compatible with   mild   chronic small vessel ischemic change. The bony calvarium is intact. The   visualized mastoid air cells and paranasal sinuses are well pneumatized   and   aerated.             Recent Results (from the past 12 hour(s))  -CBC WITH AUTOMATED DIFF  Collection Time: 09/07/17  1:07 PM       Result                      Value             Ref Range           WBC                         4.6               4.3 - 11.1 K*       RBC                         5.65 (H)          4.05 - 5.2 M*       HGB                         13.6              11.7 - 15.4 *       HCT                         42.9              35.8 - 46.3 %       MCV                         75.9 (L)          79.6 - 97.8 *       MCH                         24.1 (L)          26.1 - 32.9 *       MCHC                        31.7              31.4 - 35.0 *       RDW  16.5 (H)          11.9 - 14.6 %       PLATELET                    171               150 - 450 K/*       MPV                         11.7              9.4 - 12.3 FL       ABSOLUTE NRBC               0.00              0.0 - 0.2 K/*       DF                           AUTOMATED                             NEUTROPHILS                 46                43 - 78 %           LYMPHOCYTES                 44                13 - 44 %           MONOCYTES                   6                 4.0 - 12.0 %        EOSINOPHILS                 3                 0.5 - 7.8 %         BASOPHILS                   1                 0.0 - 2.0 %         IMMATURE GRANULOCYTES       0                 0.0 - 5.0 %         ABS. NEUTROPHILS            2.1               1.7 - 8.2 K/*       ABS. LYMPHOCYTES            2.0               0.5 - 4.6 K/*       ABS. MONOCYTES              0.3               0.1 - 1.3 K/*       ABS.  EOSINOPHILS            0.1               0.0 - 0.8 K/*       ABS. BASOPHILS              0.1               0.0 - 0.2 K/*       ABS. IMM. GRANS.            0.0               0.0 - 0.5 K/*  -METABOLIC PANEL, BASIC  Collection Time: 09/07/17  1:07 PM       Result                      Value             Ref Range           Sodium                      142               136 - 145 mm*       Potassium                   3.7               3.5 - 5.1 mm*       Chloride                    108 (H)           98 - 107 mmo*       CO2                         27                21 - 32 mmol*       Anion gap                   7                 7 - 16 mmol/L       Glucose                     86                65 - 100 mg/*       BUN                         13                8 - 23 MG/DL        Creatinine                  1.13 (H)          0.6 - 1.0 MG*       GFR est AA                  >60               >60 ml/min/1*       GFR est non-AA  50 (L)            >60 ml/min/1*       Calcium                     9.2               8.3 - 10.4 M*  -EKG, 12 LEAD, INITIAL  Collection Time: 09/07/17  1:52 PM       Result                      Value             Ref Range           Ventricular Rate            70                BPM                 Atrial Rate                 70                BPM                  P-R Interval                148               ms                  QRS Duration                66                ms                  Q-T Interval                392               ms                  QTC Calculation (Bezet)     423               ms                  Calculated P Axis           64                degrees             Calculated R Axis           23                degrees             Calculated T Axis           36                degrees             Diagnosis                                                     !! AGE AND GENDER SPECIFIC ECG  ANALYSIS !! Sinus rhythm with occasional Premature ventricular complexes Possible Left atrial enlargement Borderline ECG No previous ECGs available          Amount and/or Complexity of Data Reviewed  Clinical lab tests: ordered and reviewed  Tests in the radiology section of CPT??: ordered and reviewed  Tests in the medicine section of CPT??: ordered and reviewed      Procedures    ED Course:      Disposition:    Diagnosis:

## 2017-09-07 NOTE — ED Notes (Signed)
Patient BP 158/80. Cardene drip titrated down to 2.5 per order protocol. BP cycling. Patient denies pain or discomfort.

## 2017-09-07 NOTE — Progress Notes (Signed)
The patient was interviewed and examined by me.    Laurie Richard is a 72 yo female with a PMH of HTN.   She is being admitted with a diagnosis of HTN crisis. She has a history of non compliance. Physical exam: VS 240/110 mmHg. No jvd, cardiac: no murmurs, no gallops; lungs: clear, no rales, no wheezing; extremities: no edema; neurology: grossly normal.   Upon arrival, she received labetalol ivp, hydralazine ivp, po lisinopril and clonidine patch with no improvement. She was started on cardene gtt with plan to admit under ICU.  Full note to follow. Case staffed with Laurie QuanJill Heatherington, NP.

## 2017-09-07 NOTE — ED Notes (Signed)
Jill with hospitalist service at bedside

## 2017-09-07 NOTE — ED Notes (Signed)
Patient ambulatory to bathroom at this time

## 2017-09-07 NOTE — ED Notes (Signed)
Per Dr. Joellyn HaffGil Cruz, recheck BP in 20 minutes and if patient is still systolic 200, place patch. Verbal readback completed. Will continue to monitor patient

## 2017-09-07 NOTE — Patient Instructions (Signed)
Medicare Wellness Visit, Female     The best way to live healthy is to have a lifestyle where you eat a well-balanced diet, exercise regularly, limit alcohol use, and quit all forms of tobacco/nicotine, if applicable.   Regular preventive services are another way to keep healthy. Preventive services (vaccines, screening tests, monitoring & exams) can help personalize your care plan, which helps you manage your own care. Screening tests can find health problems at the earliest stages, when they are easiest to treat.   [facility] follows the current, evidence-based guidelines published by the [place] [employer] (USPSTF) when recommending preventive services for our patients. Because we follow these guidelines, sometimes recommendations change over time as research supports it. (For example, mammograms used to be recommended annually. Even though Medicare will still pay for an annual mammogram, the newer guidelines recommend a mammogram every two years for women of average risk.)  Of course, you and your doctor may decide to screen more often for some diseases, based on your risk and your health status.   Preventive services for you include:  - Medicare offers their members a free annual wellness visit, which is time for you and your primary care provider to discuss and plan for your preventive service needs. Take advantage of this benefit every year!  -All adults over the age of 29 should receive the recommended pneumonia vaccines. Current USPSTF guidelines recommend a series of two vaccines for the best pneumonia protection.   -All adults should have a flu vaccine yearly and a tetanus vaccine every 10 years. All adults age 33 and older should receive a shingles vaccine once in their lifetime.    -A bone mass density test is recommended when a woman turns 65 to screen for osteoporosis. This test is only recommended one time, as a screening.  Some providers will use this same test as a disease monitoring tool if you already have osteoporosis.  -All adults age 67-70 who are overweight should have a diabetes screening test once every three years.   -Other screening tests and preventive services for persons with diabetes include: an eye exam to screen for diabetic retinopathy, a kidney function test, a foot exam, and stricter control over your cholesterol.   -Cardiovascular screening for adults with routine risk involves an electrocardiogram (ECG) at intervals determined by your doctor.   -Colorectal cancer screenings should be done for adults age 58-75 with no increased risk factors for colorectal cancer.  There are a number of acceptable methods of screening for this type of cancer. Each test has its own benefits and drawbacks. Discuss with your doctor what is most appropriate for you during your annual wellness visit. The different tests include: colonoscopy (considered the best screening method), a fecal occult blood test, a fecal DNA test, and sigmoidoscopy.  -Breast cancer screenings are recommended every other year for women of normal risk, age 72-74.  -Cervical cancer screenings for women over age 110 are only recommended with certain risk factors.   -All adults born between [birth year] and 1965 should be screened once for Hepatitis C.     Here is a list of your current Health Maintenance items (your personalized list of preventive services) with a due date:  Health Maintenance Due   Topic Date Due   ??? Hepatitis C Test  1945/12/25   ??? DTaP/Tdap/Td  (1 - Tdap) 10/27/1965   ??? Shingles Vaccine (1 of 2) 10/28/1994   ??? Mammogram  10/28/1994   ??? Stool  testing for trace blood  10/28/1994   ??? Glaucoma Screening   10/27/2009   ??? Bone Mineral Density   10/27/2009   ??? Pneumococcal Vaccine (2 of 2 - PPSV23) 10/16/2015   ??? Annual Well Visit  05/17/2017   ??? Flu Vaccine  08/11/2017

## 2017-09-07 NOTE — Progress Notes (Signed)
This is an Initial Medicare Annual Wellness Exam (AWV) (Performed 12 months after IPPE or effective date of Medicare Part B enrollment, Once in a lifetime)    I have reviewed the patient's medical history in detail and updated the computerized patient record.     HTN: Pt was seen here 3 months ago with plan for 2 week FU on BP, she returns today. BP is not audible to my MA who then used the machine and BP was very high:    BP Readings from Last 3 Encounters:   09/07/17 (!) 210/131   05/27/17 140/90   05/17/17 (!) 164/102     By auscultation I get 210/140 BP.     She states she has only been taking 1/2 pill a day of her lisinopril because she states her BP will get "too low." When I ask her how often she checks her BP outside this office, she states she does not.     She is supposed to be on 4m of lisinopril. This was after she refused to take HCTZ.     She scores poorly on a minicog today, she recalled 2/3 words and her clock numbers were off.     Lab Results   Component Value Date/Time    Sodium 142 05/17/2017 03:29 PM    Potassium 4.0 05/17/2017 03:29 PM    Chloride 99 05/17/2017 03:29 PM    CO2 25 05/17/2017 03:29 PM    Glucose 73 05/17/2017 03:29 PM    BUN 15 05/17/2017 03:29 PM    Creatinine 0.99 05/17/2017 03:29 PM    BUN/Creatinine ratio 15 05/17/2017 03:29 PM    GFR est AA 66 05/17/2017 03:29 PM    GFR est non-AA 57 (L) 05/17/2017 03:29 PM    Calcium 9.6 05/17/2017 03:29 PM    Bilirubin, total 0.9 05/17/2017 03:29 PM    AST (SGOT) 26 05/17/2017 03:29 PM    Alk. phosphatase 90 05/17/2017 03:29 PM    Protein, total 7.1 05/17/2017 03:29 PM    Albumin 4.3 05/17/2017 03:29 PM    A-G Ratio 1.5 05/17/2017 03:29 PM    ALT (SGPT) 18 05/17/2017 03:29 PM     Lab Results   Component Value Date/Time    TSH 1.310 05/17/2017 03:29 PM           HM:  Cologuard was ordered, she did not do this.   Mammo: Also ordered in may, she did not do this.   PNA shots should be up to date.     History    History reviewed. No pertinent past medical history.   Past Surgical History:   Procedure Laterality Date   ??? HX ANKLE FRACTURE TX     ??? HX BILATERAL SALPINGO-OOPHORECTOMY     ??? HX HYSTERECTOMY     ??? HX TONSILLECTOMY       Current Outpatient Medications   Medication Sig Dispense Refill   ??? lisinopril (PRINIVIL, ZESTRIL) 20 mg tablet Take 1 Tab by mouth daily. 90 Tab 1     Allergies   Allergen Reactions   ??? Opioids - Morphine Analogues Unknown (comments)     Had major altered state, felt like she was going to have a stroke     Family History   Problem Relation Age of Onset   ??? Hypertension Mother    ??? Heart Disease Mother    ??? No Known Problems Father    ??? Hypertension Sister      Social History  Tobacco Use   ??? Smoking status: Never Smoker   ??? Smokeless tobacco: Never Used   Substance Use Topics   ??? Alcohol use: Not Currently     Frequency: Never     Patient Active Problem List   Diagnosis Code   ??? Essential hypertension I10   ??? Screening mammogram, encounter for Z12.31   ??? Screen for colon cancer Z12.11   ??? Allergic rhinitis J30.9   ??? Dysfunction of both eustachian tubes H69.83   ??? Class 1 obesity due to excess calories with body mass index (BMI) of 30.0 to 30.9 in adult E66.09, Z68.30   ??? Abscessed tooth K04.7   ??? Hypertensive emergency I16.1   ??? Routine general medical examination at a health care facility Z00.00   ??? Medically noncompliant Z91.19       Depression Risk Factor Screening:     3 most recent PHQ Screens 09/07/2017   Little interest or pleasure in doing things Not at all   Feeling down, depressed, irritable, or hopeless Not at all   Total Score PHQ 2 0     Alcohol Risk Factor Screening:   You do not drink alcohol or very rarely.    Functional Ability and Level of Safety:     Hearing Loss  Hearing is good.    Activities of Daily Living  The home contains: no safety equipment.  Patient does total self care    Fall Risk  Fall Risk Assessment, last 12 mths 09/07/2017   Able to walk? Yes    Fall in past 12 months? No       Abuse Screen  Patient is not abused    Cognitive Screening   Evaluation of Cognitive Function:  Has your family/caregiver stated any concerns about your memory: no  Normal    Patient Care Team   Patient Care Team:  Telesforo Brosnahan, Marti Sleigh, MD as PCP - General (Family Practice)    Review of Systems   Constitutional: Negative for chills and fever.   Respiratory: Negative for cough and shortness of breath.    Cardiovascular: Negative for chest pain and palpitations.   Gastrointestinal: Negative for abdominal pain, blood in stool, melena, nausea and vomiting.   Genitourinary: Negative for hematuria.   Neurological: Negative for speech change, focal weakness, seizures, loss of consciousness, weakness and headaches.     Physical Exam   Constitutional: She is oriented to person, place, and time. She appears well-developed and well-nourished. No distress.   HENT:   Head: Normocephalic and atraumatic.   Nose: Nose normal.   Mouth/Throat: Oropharynx is clear and moist.   Eyes: Pupils are equal, round, and reactive to light. Conjunctivae and EOM are normal.   Neck: Normal range of motion. Neck supple.   Cardiovascular: Normal rate, regular rhythm and normal heart sounds. Exam reveals no gallop and no friction rub.   No murmur heard.  Pulses are strong   Pulmonary/Chest: Effort normal and breath sounds normal. No respiratory distress. She has no wheezes. She has no rales.   Abdominal: Soft. Bowel sounds are normal. She exhibits no distension and no mass. There is no tenderness. There is no rebound and no guarding.   Obese   Musculoskeletal: Normal range of motion. She exhibits no edema or tenderness.   Neurological: She is alert and oriented to person, place, and time. She has normal reflexes. No cranial nerve deficit. Coordination normal.   She is AxOx3 but her clock drawing was off today and only had  2/3 recall. This is not normal for her.     Skin: Skin is warm and dry. No rash noted. She is not diaphoretic. No erythema.   Psychiatric: She has a normal mood and affect. Her behavior is normal.   Nursing note and vitals reviewed.        Assessment/Plan   Education and counseling provided:  Are appropriate based on today's review and evaluation    1. Hypertensive emergency  BP is dangerously high and she is showing signs of neurologic difficulty today. We have called EMS and she will be brought to the [facility] hospital. I do not think she would go if I allowed her to go by private vehicle because she has demonstrated medical noncompliance on multiple fronts since coming here. There are psychosocial reasons for this, and I empathize, but her risk of stroke is pretty high and she is already showing some signs of CNS difficulty. Appreciate the ER seeing her, I will see her in FU and will likely get cardiology involved if they haven't seen her by that point already.   - AMB POC EKG ROUTINE W/ 12 LEADS, INTER & REP    2. Routine general medical examination at a health care facility  She did not send cologuard back. Did not call back to schedule her mammogram. Will need a flu shot. Will reinforce these when I see her back.     3. Medically noncompliant  This is the root of her issues. She thinks she knows how to dose her own medications. I do not know if I will be able to break through this and get her on board with treatment plans. We talked about this while waiting for EMS. She states she is willing to see me in close FU and start chipping away at her health maintenance and BP issues.      FU pending hospital dispo    Health Maintenance Due   Topic Date Due   ??? Hepatitis C Screening  September 14, 1945   ??? DTaP/Tdap/Td series (1 - Tdap) 10/27/1965   ??? Shingrix Vaccine Age 64> (1 of 2) 10/28/1994   ??? BREAST CANCER SCRN MAMMOGRAM  10/28/1994   ??? FOBT Q 1 YEAR AGE 43-75  10/28/1994   ??? GLAUCOMA SCREENING Q2Y  10/27/2009   ??? Bone Densitometry (Dexa) Screening  10/27/2009    ??? MEDICARE YEARLY EXAM  05/17/2017   ??? Influenza Age 5 to Adult  08/11/2017

## 2017-09-07 NOTE — ED Notes (Signed)
Bedside Report to Remi, RN. Care transferred at this time.

## 2017-09-07 NOTE — H&P (Signed)
H&P by Leward Quan, NP at 09/07/17 1717                Author: Leward Quan, NP  Service: Internal Medicine  Author Type: Nurse Practitioner       Filed: 09/07/17 2320  Date of Service: 09/07/17 1717  Status: Attested           Editor: Leward Quan, NP (Nurse Practitioner)  Cosigner: Joellyn Haff, Robbie Louis, MD at 09/08/17 [phone]          Attestation signed by Joellyn Haff, Robbie Louis, MD at 09/08/17 [phone]          I personally interviewed and examined this patient.   I agree with the assessment and plan.   She is being admitted for HTN crisis. She responded to medications given in the ED and she will be admitted for HTN control.   Physical exam: in no distress, VS checked; no jvd; cardiac: normal s1, s2,no murmurs, no gallops; lungs: clear, no rales, no wheezing; abdomen: soft, non tender, no masses; extremities: no pedal edema.   Plan. Resume home po meds. DC cardene gtt.                                    Hospitalist Admission History and Physical        CHIEF COMPLAINT:  Hypertensive Urgency        Subjective:     Patient is an alert, oriented  72 y.o. African American female who presents to ER this afternoon from [facility] office  where she was found with blood pressure of 220/140.  No chest pain, headache, visual change, neuro symptoms.  This is not a new problem, old records review indicates she has been seen multiple times, mostly in the ER,  since at least 2014 for the same  issue with extensive history of noncompliance.  She informs me she just needs a little lemon juice for her blood pressure.  Does not listen to reasons.  Administered labetalol, IVP, hydralazine IV, oral lisinopril and clonidine patch 0.2 mg without subsequent  lowering of blood pressure.  Seems perplexed at blood pressure resistance. Dr Joellyn Haff in for exam, will  Have to begin Cardene drip and admit to critical care unit for closer monitoring.  Of note, PHP note from earlier today indicates patient was unable  to draw the face of a  clock correctly.  May have early dementia, needs complete dementia workup on discharge. Additional history below.  Denies chest pain or SOB.  Noted frequent ventricular beats and intermittent bigeminy.  Reports recent palpitations,  has not mentioned to PHP.  Denies syncope or near syncope or feeling as if heart is stopping.  Denies past cardiology workup.        Past Medical History:        Diagnosis  Date         ?  Hypertension  50'S ?          PATIENT HAS BEEN NONCOMPLAINT WITH ANTIHYPERTENSIVE MEDS SINCE AT LEAST 2015         ?  Noncompliance                Past Surgical History:         Procedure  Laterality  Date          ?  HX ANKLE FRACTURE TX    2019  LEFT ANKLE FRACTURE WITH ORIF          ?  HX BILATERAL SALPINGO-OOPHORECTOMY         ?  HX HYSTERECTOMY              ?  HX TONSILLECTOMY               Family History         Problem  Relation  Age of Onset          ?  Hypertension  Mother                AGE 3          ?  Heart Disease  Mother       ?  Cancer  Father                PROSTATE.  DIED AT 7565          ?  Hypertension  Sister                AND DIABETES          ?  Hypertension  Brother       ?  Other  Brother                DIED AT 4617. MVA          ?  Drug Abuse  Brother                DIED AT 6265             Social History          Social History Narrative          09/07/17:  PATIENT IS WIDOWED.  LIVES ALONE.  WORKS PART TIME AS A CNA.  HAS ONE DAUGHTER, 2 SONS LIVING IN [place]              Social History          Substance and Sexual Activity        Drug Use  Never             Allergies        Allergen  Reactions         ?  Opioids - Morphine Analogues  Unknown (comments)             Had major altered state, felt like she was going to have a stroke         ?  Peanut  Other (comments)             HEADACHE             Prior to Admission medications             Medication  Sig  Start Date  End Date  Taking?  Authorizing Provider            lisinopril (PRINIVIL, ZESTRIL) 20 mg tablet  Take 1 Tab by  mouth daily.  08/17/17      Coben, Elissa HeftyJason M, MD        PHP:  Roberts Gaudyoben, Jason M, MD      Review of Systems   A comprehensive review of systems was negative except for that written in the HPI.        Objective:        Visit Vitals      BP  (!) 204/95     Pulse  69     Temp  98 ??F (36.7 ??C)     Resp  18     Ht  5' (1.524 m)     Wt  71.7 kg (158 lb)     SpO2  99%        BMI  30.86 kg/m??         Data Review:      Recent Results (from the past 24 hour(s))     CBC WITH AUTOMATED DIFF          Collection Time: 09/07/17  1:07 PM         Result  Value  Ref Range            WBC  4.6  4.3 - 11.1 K/uL       RBC  5.65 (H)  4.05 - 5.2 M/uL       HGB  13.6  11.7 - 15.4 g/dL       HCT  16.1  09.6 - 46.3 %       MCV  75.9 (L)  79.6 - 97.8 FL       MCH  24.1 (L)  26.1 - 32.9 PG       MCHC  31.7  31.4 - 35.0 g/dL       RDW  04.5 (H)  40.9 - 14.6 %       PLATELET  171  150 - 450 K/uL       MPV  11.7  9.4 - 12.3 FL       ABSOLUTE NRBC  0.00  0.0 - 0.2 K/uL       DF  AUTOMATED          NEUTROPHILS  46  43 - 78 %       LYMPHOCYTES  44  13 - 44 %       MONOCYTES  6  4.0 - 12.0 %       EOSINOPHILS  3  0.5 - 7.8 %       BASOPHILS  1  0.0 - 2.0 %       IMMATURE GRANULOCYTES  0  0.0 - 5.0 %       ABS. NEUTROPHILS  2.1  1.7 - 8.2 K/UL       ABS. LYMPHOCYTES  2.0  0.5 - 4.6 K/UL       ABS. MONOCYTES  0.3  0.1 - 1.3 K/UL       ABS. EOSINOPHILS  0.1  0.0 - 0.8 K/UL       ABS. BASOPHILS  0.1  0.0 - 0.2 K/UL       ABS. IMM. GRANS.  0.0  0.0 - 0.5 K/UL       METABOLIC PANEL, BASIC          Collection Time: 09/07/17  1:07 PM         Result  Value  Ref Range            Sodium  142  136 - 145 mmol/L       Potassium  3.7  3.5 - 5.1 mmol/L       Chloride  108 (H)  98 - 107 mmol/L       CO2  27  21 - 32 mmol/L       Anion gap  7  7 - 16 mmol/L       Glucose  86  65 - 100 mg/dL  BUN  13  8 - 23 MG/DL       Creatinine  0.45 (H)  0.6 - 1.0 MG/DL       GFR est AA  >40  >60 ml/min/1.107m2       GFR est non-AA  50 (L)  >60 ml/min/1.46m2       Calcium  9.2  8.3 -  10.4 MG/DL       EKG, 12 LEAD, INITIAL          Collection Time: 09/07/17  1:52 PM         Result  Value  Ref Range            Ventricular Rate  70  BPM       Atrial Rate  70  BPM       P-R Interval  148  ms       QRS Duration  66  ms       Q-T Interval  392  ms       QTC Calculation (Bezet)  423  ms       Calculated P Axis  64  degrees       Calculated R Axis  23  degrees       Calculated T Axis  36  degrees       Diagnosis                 !! AGE AND GENDER SPECIFIC ECG ANALYSIS !!   Sinus rhythm with occasional Premature ventricular complexes   Possible Left atrial enlargement   Borderline ECG   No previous ECGs available   Confirmed by [facility]  MD (UC), Karie Kirks ([id number]) on 09/07/2017 4:39:07 PM           8/28:  CXR:  Unremarkable portable chest radiograph   ??   8/28:  CT HEAD: Mild chronic small vessel ischemic changes.      Old records reviewed.       PHYSICAL EXAM:   General appearance: Oriented to time, place, person.  Very poor historian.  Does not accept medical advice readily, questions treatment and tests.  Alert, not listening to provider.  No acute distress,  no chest pain, no SOB.  Head: Normocephalic, without obvious abnormality, atraumatic   Eyes: conjunctivae/corneas clear. PERRL, EOM's grossly intact.    Throat: Lips, mucosa, and tongue normal. Teeth and gums normal   Neck: supple, symmetrical, trachea midline, thyroid: not enlarged, symmetric, no tenderness/mass/nodules, no carotid bruit and no JVD   Lungs: clear to auscultation bilaterally   Heart: regular rate and rhythm with PVCs and bigeminy, S1, S2 normal, no murmur, click, rub or gallop   Abdomen: soft, non-tender. Bowel sounds normal. No masses,  no organomegaly   Extremities: extremities normal, atraumatic, no cyanosis or edema   Skin: Skin color, texture, turgor normal. No rashes or lesions   Neurologic: Grossly normal        Assessment/Plan:     Hypertensive urgency/Hypertensive crisis  not responsive to multiple meds    Begin cardene drip,  will have to go to closed critical  unit per protocol    ASA 81 daily    Renal ultrasound    Check lipids, A1C    Monitor vitals closely    Renal ultrasound and Echocardiogram pending     Suspect high degree of non compliance     Begin catapres patch 0.2 mg/24 hours    Prn apresoline    VS q 4 hours  Medically noncompliant x many years with resistence to change.    Ventricular ectopics:  PVCS WITH BIGEMINY NOTED   Home med reviewed and reconciled by me   AM labs:  TSH, urinalysis, lipid panel   2 D echo   Unsteady gait    PT/OT/CM consults    PPD      FULL CODE   LOVENOX FOR DVT PROPHY   HOME MEDS REVIEWED AND RECONCILED BY ME      Signed By:  Leward Quan, NP        September 07, 2017

## 2017-09-07 NOTE — ED Notes (Signed)
Bedside Report to [facility], RN. Care transferred at this time.

## 2017-09-07 NOTE — ED Notes (Signed)
Report received from AddisonBailey, RN to assume care at this time. Patient continues on Cardene drip at this time. BP cycling.

## 2017-09-07 NOTE — Progress Notes (Signed)
The patient was interviewed and examined by me.    Laurie Richard is a 72 yo female with a PMH of HTN.   She is being admitted with a diagnosis of HTN crisis. She has a history of non compliance. Physical exam: VS 240/110 mmHg. No jvd, cardiac: no murmurs, no gallops; lungs: clear, no rales, no wheezing; extremities: no edema; neurology: grossly normal.   Upon arrival, she received labetalol ivp, hydralazine ivp, po lisinopril and clonidine patch with no improvement. She was started on cardene gtt with plan to admit under ICU.  Full note to follow. Case staffed with Jill Heatherington, NP.

## 2017-09-07 NOTE — ED Notes (Signed)
Report received from Bailey, RN to assume care at this time. Patient continues on Cardene drip at this time. BP cycling.

## 2017-09-07 NOTE — ED Notes (Addendum)
Per Dr. Gil Cruz, recheck BP in 20 minutes and if patient is still systolic 200, place patch. Verbal readback completed. Will continue to monitor patient

## 2017-09-07 NOTE — Progress Notes (Signed)
Chart accessed for possible admission to unit [facility].

## 2017-09-07 NOTE — ED Notes (Signed)
Patient BP 158/80. Cardene drip titrated down to 2.5 per order protocol. BP cycling. Patient denies pain or discomfort.

## 2017-09-07 NOTE — Progress Notes (Signed)
Chart accessed for possible admission to unit per Brown RN.

## 2017-09-07 NOTE — ED Notes (Signed)
Cardene drip stopped at this time related to BP of 154/78. BP cycling.

## 2017-09-08 ENCOUNTER — Inpatient Hospital Stay: Admit: 2017-09-08 | Payer: PRIVATE HEALTH INSURANCE | Primary: Family Medicine

## 2017-09-08 ENCOUNTER — Inpatient Hospital Stay: Payer: PRIVATE HEALTH INSURANCE | Primary: Family Medicine

## 2017-09-08 LAB — TSH 3RD GENERATION
TSH: 3 u[IU]/mL (ref 0.358–3.740)
TSH: 3 u[IU]/mL (ref 0.358–3.740)

## 2017-09-08 LAB — LIPID PANEL
CHOL/HDL Ratio: 2.2
Chol/HDL Ratio: 2.2
Cholesterol, Total: 254 MG/DL — ABNORMAL HIGH (ref <200)
Cholesterol, total: 254 MG/DL — ABNORMAL HIGH (ref <200)
HDL Cholesterol: 113 MG/DL — ABNORMAL HIGH (ref 40–60)
HDL: 113 MG/DL — ABNORMAL HIGH (ref 40–60)
LDL Calculated: 128.4 MG/DL — ABNORMAL HIGH (ref <100)
LDL, calculated: 128.4 MG/DL — ABNORMAL HIGH (ref <100)
Triglyceride: 63 MG/DL (ref 35–150)
Triglycerides: 63 MG/DL (ref 35–150)
VLDL Cholesterol Calculated: 12.6 MG/DL (ref 6.0–23.0)
VLDL, calculated: 12.6 MG/DL (ref 6.0–23.0)

## 2017-09-08 LAB — HEMOGLOBIN A1C WITH EAG
Est. average glucose: 120 mg/dL
Hemoglobin A1c: 5.8 % (ref 4.8–6.0)

## 2017-09-08 LAB — HEMOGLOBIN A1C W/EAG
Hemoglobin A1C: 5.8 % (ref 4.8–6.0)
eAG: 120 mg/dL

## 2017-09-08 LAB — ECHOCARDIOGRAM COMPLETE 2D W DOPPLER W COLOR: Left Ventricular Ejection Fraction: 73

## 2017-09-08 MED ORDER — LISINOPRIL 20 MG TAB
20 mg | Freq: Every day | ORAL | Status: DC
Start: 2017-09-08 — End: 2017-09-09
  Administered 2017-09-08 – 2017-09-09 (×2): via ORAL

## 2017-09-08 MED ORDER — TUBERCULIN PPD 5 UNIT/0.1 ML INTRADERMAL
5 tub. unit /0.1 mL | Freq: Once | INTRADERMAL | Status: AC
Start: 2017-09-08 — End: 2017-09-09

## 2017-09-08 MED ORDER — SODIUM CHLORIDE 0.9 % IJ SYRG
Freq: Three times a day (TID) | INTRAMUSCULAR | Status: DC
Start: 2017-09-08 — End: 2017-09-09
  Administered 2017-09-08 – 2017-09-09 (×5): via INTRAVENOUS

## 2017-09-08 MED ORDER — HYDRALAZINE 20 MG/ML IJ SOLN
20 mg/mL | Freq: Four times a day (QID) | INTRAMUSCULAR | Status: DC | PRN
Start: 2017-09-08 — End: 2017-09-09
  Administered 2017-09-08 – 2017-09-09 (×2): via INTRAVENOUS

## 2017-09-08 MED ORDER — SODIUM CHLORIDE 0.9 % INJECTION
1.1 mg/mL | INTRAMUSCULAR | Status: AC | PRN
Start: 2017-09-08 — End: 2017-09-08
  Administered 2017-09-08: 13:00:00 via INTRAVENOUS

## 2017-09-08 MED ORDER — ASPIRIN 81 MG CHEWABLE TAB
81 mg | Freq: Every day | ORAL | Status: DC
Start: 2017-09-08 — End: 2017-09-09
  Administered 2017-09-08 – 2017-09-09 (×2): via ORAL

## 2017-09-08 MED ORDER — SODIUM CHLORIDE 0.9 % IJ SYRG
INTRAMUSCULAR | Status: DC | PRN
Start: 2017-09-08 — End: 2017-09-09

## 2017-09-08 MED ORDER — HYDRALAZINE 20 MG/ML IJ SOLN
20 mg/mL | Freq: Four times a day (QID) | INTRAMUSCULAR | Status: DC | PRN
Start: 2017-09-08 — End: 2017-09-08

## 2017-09-08 MED ORDER — SENNOSIDES-DOCUSATE SODIUM 8.6 MG-50 MG TAB
Freq: Two times a day (BID) | ORAL | Status: DC | PRN
Start: 2017-09-08 — End: 2017-09-09

## 2017-09-08 MED ORDER — ACETAMINOPHEN 325 MG TABLET
325 mg | ORAL | Status: DC | PRN
Start: 2017-09-08 — End: 2017-09-09

## 2017-09-08 MED ORDER — ENOXAPARIN 40 MG/0.4 ML SUB-Q SYRINGE
40 mg/0.4 mL | SUBCUTANEOUS | Status: DC
Start: 2017-09-08 — End: 2017-09-09
  Administered 2017-09-08 – 2017-09-09 (×2): via SUBCUTANEOUS

## 2017-09-08 MED FILL — DEFINITY 1.1 MG/ML INTRAVENOUS SUSPENSION: 1.1 mg/mL | INTRAVENOUS | Qty: 1.3

## 2017-09-08 MED FILL — ASPIRIN 81 MG CHEWABLE TAB: 81 mg | ORAL | Qty: 1

## 2017-09-08 MED FILL — LOVENOX 40 MG/0.4 ML SUBCUTANEOUS SYRINGE: 40 mg/0.4 mL | SUBCUTANEOUS | Qty: 0.4

## 2017-09-08 MED FILL — LISINOPRIL 20 MG TAB: 20 mg | ORAL | Qty: 1

## 2017-09-08 MED FILL — HYDRALAZINE 20 MG/ML IJ SOLN: 20 mg/mL | INTRAMUSCULAR | Qty: 1

## 2017-09-08 NOTE — Other (Signed)
Interdisciplinary team rounds were held 09/08/2017 with the following team members:Care Management, Physical Therapy, Physician and Clinical Coordinator and the patient.    Plan of care discussed. See clinical pathway and/or care plan for interventions and desired outcomes.

## 2017-09-08 NOTE — Progress Notes (Signed)
 Pt received to floor. Alert and oriented x4. Ambulatory, up ad lib. Denies pain and does not appear to be in distress. TB test given on right arm. Requested food with no salt, applesauce given. BP 155/89, P 63 upon arrival to floor. Peripheral IV in right hand was infiltrated upon arrival to floor. Will attempt to place new peripheral IV. 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 Pt is resting quietly in bed and reading at this time. Pt is stable.

## 2017-09-08 NOTE — Progress Notes (Signed)
Pt received to floor. Alert and oriented x4. Ambulatory, up ad lib. Denies pain and does not appear to be in distress. TB test given on right arm. Requested food with no salt, applesauce given. BP 155/89, P 63 upon arrival to floor. Peripheral IV in right hand was infiltrated upon arrival to floor. Will attempt to place new peripheral IV. 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 Pt is stable.

## 2017-09-08 NOTE — Progress Notes (Signed)
Patient admitted overnight. She presented from her PCP's office with hypertensive urgency. She is independent at baseline. Case Management will follow for discharge planning needs.    Care Management Interventions  PCP Verified by CM: Yes(Coben)  Transition of Care Consult (CM Consult): Discharge Planning  Discharge Durable Medical Equipment: No  Physical Therapy Consult: Yes  Occupational Therapy Consult: Yes  Speech Therapy Consult: No  Current Support Network: Own Home  Confirm Follow Up Transport: Self  Plan discussed with Pt/Family/Caregiver: Yes  Freedom of Choice Offered: Yes  Discharge Location  Discharge Placement: Home

## 2017-09-08 NOTE — Progress Notes (Signed)
Pt is stable, resting in bed. BP came down to 137/77 after administration of PRN hydralazine. Denies pain, does not appear to be in distress. Respirations even and unlabored. Safety measures in place, bed low and locked, call light in reach. Denies any needs at this time. SBAR Report to be given to oncoming nurse.

## 2017-09-08 NOTE — Progress Notes (Signed)
Order received, chart reviewed, evaluation attempted; pt off floor for US, will follow up as schedule allows.    Gordy CouncilmanNatalie S Edelkamp, OT

## 2017-09-08 NOTE — Progress Notes (Signed)
Order received, chart reviewed, evaluation attempted; pt off floor for US, will follow up as schedule allows.    Natalie S Edelkamp, OT

## 2017-09-08 NOTE — Progress Notes (Signed)
Given PRN Hydralazine for BP 161/99, P 91.

## 2017-09-08 NOTE — Progress Notes (Signed)
TRANSFER - IN REPORT:    Verbal report received from  ER RN on Laurie Richard  being received from ED(unit) for routine progression of care      Report consisted of patient???s Situation, Background, Assessment and   Recommendations(SBAR).     Information from the following report(s) SBAR was reviewed with the receiving nurse.    Opportunity for questions and clarification was provided.      Assessment completed upon patient???s arrival to unit and care assumed.

## 2017-09-08 NOTE — Progress Notes (Signed)
Given PRN Hydralazine for BP 161/99, P 91.

## 2017-09-08 NOTE — Progress Notes (Signed)
PHYSICAL THERAPY: Initial Assessment, Discharge and AM 09/08/2017  INPATIENT:  eval only  Payor: [employer] / [place] [employer] / Product Type: Managed Care Medicare /       NAME/AGE/GENDER: Laurie Richard is a 72 y.o. female   PRIMARY DIAGNOSIS: Hypertensive urgency [I16.0]  Hypertensive crisis [I16.9] Hypertensive urgency   Hypertensive urgency     ICD-10: Treatment Diagnosis:    Difficulty in walking, Not elsewhere classified (R26.2)   Precaution/Allergies:  Opioids - morphine analogues and Peanut      ASSESSMENT:     Laurie Richard is a pleasant 72 y.o female presenting to hospital from MD office with extreme HTN. Pt A & Ox 4 this date, on room air. Pt states no pain, feels better. Pt reports lives alone in one story home, 2-3 steps to enter. Pt states independent at baseline for all activity, no falls, drives. Pt today standing in room on arrival, friend present. Pt demo independence with transfers. Pt ambulated into hallway without assistive device for 250', SBA to Supervision, no LOB with activity. Pt seated in chair, MMT B LE grossly 5/5 in strength. Pt with no needs at this time, will discharge from PT. Please re-order if medical status changes during this admission.     This section established at most recent assessment   PROBLEM LIST (Impairments causing functional limitations):  discharge    INTERVENTIONS PLANNED: (Benefits and precautions of physical therapy have been discussed with the patient.)  discharge      TREATMENT PLAN: Frequency/Duration: discharge  Rehabilitation Potential For Stated Goals: Excellent     REHAB RECOMMENDATIONS (at time of discharge pending progress):    Placement:  It is my opinion, based on this patient's performance to date, that Laurie Richard may benefit from being discharged with NO further skilled therapy due to a proven ability to function at baseline.  Equipment:   None at this time              HISTORY:    History of Present Injury/Illness (Reason for Referral):  Patient is an alert, oriented 72 y.o. African American female who presents to ER this afternoon from [facility] office where she was found with blood pressure of 220/140.  No chest pain, headache, visual change, neuro symptoms.  This is not a new problem, old records review indicates she has been seen multiple times, mostly in the ER,  since at least 2014 for the same issue with extensive history of noncompliance.  She informs me she just needs a little lemon juice for her blood pressure.  Does not listen to reasons.  Administered labetalol, IVP, hydralazine IV, oral lisinopril and clonidine patch 0.2 mg without subsequent lowering of blood pressure.  Seems perplexed at blood pressure resistance. Dr Joellyn Haff in for exam, will  Have to begin Cardene drip and admit to critical care unit for closer monitoring.  Of note, PHP note from earlier today indicates patient was unable to draw the face of a clock correctly.  May have early dementia, needs complete dementia workup on discharge. Additional history below.  Denies chest pain or SOB.  Noted frequent ventricular beats and intermittent bigeminy.  Reports recent palpitations, has not mentioned to PHP.  Denies syncope or near syncope or feeling as if heart is stopping.  Denies past cardiology workup.     Past Medical History/Comorbidities:   Laurie Richard  has a past medical history of Hypertension (50'S ?) and Noncompliance.  Laurie Richard  has a past  surgical history that includes hx ankle fracture tx (2019); hx hysterectomy; hx tonsillectomy; and hx bilateral salpingo-oophorectomy.  Social History/Living Environment:   Home Environment: Private residence  # Steps to Enter: 3  One/Two Story Residence: One story  Living Alone: Yes  Support Systems: Friends \\ neighbors  Patient Expects to be Discharged to:: Private residence  Current DME Used/Available at Home: None  Tub or Shower Type: Tub/Shower combination   Prior Level of Function/Work/Activity:  Lives alone, independent at baseline, drives  Personal Factors:          Sex:  female        Age:  72 y.o.        Overall Behavior:  agreeable    Number of Personal Factors/Comorbidities that affect the Plan of Care:  Age, HTN 1-2: MODERATE COMPLEXITY   EXAMINATION:   Most Recent Physical Functioning:   Gross Assessment:  AROM: Within functional limits(B LE)  Strength: Within functional limits(B LE grossly 5/5)  Coordination: Within functional limits               Posture:  Posture (WDL): Within defined limits  Balance:  Sitting: Intact  Standing: Intact Bed Mobility:  Supine to Sit: (up in room on arrival)  Sit to Supine: (left up in chair)  Scooting: Independent  Wheelchair Mobility:     Transfers:  Sit to Stand: Independent  Stand to Sit: Independent  Gait:            Body Structures Involved:  Muscles Body Functions Affected:  Movement Related Activities and Participation Affected:  General Tasks and Demands   Number of elements that affect the Plan of Care: 3: MODERATE COMPLEXITY   CLINICAL PRESENTATION:   Presentation: Stable and uncomplicated: LOW COMPLEXITY   CLINICAL DECISION MAKING:   [employer] AM-PAC??? ???6 Clicks???   Basic Mobility Inpatient Short Form  How much difficulty does the patient currently have... Unable A Lot A Little None   1.  Turning over in bed (including adjusting bedclothes, sheets and blankets)?   ? 1   ? 2   ? 3   ? 4   2.  Sitting down on and standing up from a chair with arms ( e.g., wheelchair, bedside commode, etc.)   ? 1   ? 2   ? 3   ? 4   3.  Moving from lying on back to sitting on the side of the bed?   ? 1   ? 2   ? 3   ? 4   How much help from another person does the patient currently need... Total A Lot A Little None   4.  Moving to and from a bed to a chair (including a wheelchair)?   ? 1   ? 2   ? 3   ? 4   5.  Need to walk in hospital room?   ? 1   ? 2   ? 3   ? 4    6.  Climbing 3-5 steps with a railing?   ? 1   ? 2   ? 3   ? 4   ?? 2007, [employer], under license to [employer]. All rights reserved      Score:  Initial: 24 Most Recent: X (Date: -- )    Interpretation of Tool:  Represents activities that are increasingly more difficult (i.e. Bed mobility, Transfers, Gait).    Medical Necessity:     Discharge, no  skilled need at this time.  Reason for Services/Other Comments:  discharge.   Use of outcome tool(s) and clinical judgement create a POC that gives a: Clear prediction of patient's progress: LOW COMPLEXITY            TREATMENT:   (In addition to Assessment/Re-Assessment sessions the following treatments were rendered)   Pre-treatment Symptoms/Complaints:  "I am doing much better"  Pain: Initial:   Pain Intensity 1: 0  Post Session:  0/10     Assessment/Reassessment only, no treatment provided today    Braces/Orthotics/Lines/Etc:   O2 Device: Room air  Treatment/Session Assessment:    Response to Treatment:  tolerated well  Interdisciplinary Collaboration:   Physical Therapist  Registered Nurse  After treatment position/precautions:   Up in chair  Bed/Chair-wheels locked  Bed in low position  Call light within reach  Visitors at bedside   Compliance with Program/Exercises: discharge   Recommendations/Intent for next treatment session:  discharge.  Total Treatment Duration:  PT Patient Time In/Time Out  Time In: 1156  Time Out: 1208    Wynn Banker, PT

## 2017-09-08 NOTE — Progress Notes (Signed)
Patient received to room 818 from ultrasound

## 2017-09-08 NOTE — Progress Notes (Signed)
Pt is stable, resting in bed. BP came down to 137/77 after administration of PRN hydralazine. Denies pain, does not appear to be in distress. Respirations even and unlabored. Safety measures in place, bed low and locked, call light in reach. Denies any needs at this time. SBAR Report to be given to oncoming nurse.

## 2017-09-08 NOTE — Progress Notes (Signed)
SBAR report received from [place], Charity fundraiser. Pt is stable. Alert and oriented. Calm and cooperative. Pt is resting quietly in bed. Respirations even and unlabored. Safety measures in place, bed low and locked, call light in reach. Encouraged to call with any needs.

## 2017-09-08 NOTE — Progress Notes (Signed)
Hospitalist Progress Note    09/08/2017  Admit Date: 09/07/2017  1:29 PM   NAME: Laurie Richard   DOB:  07/03/1945   MRN:  [id number]   Attending: Joellyn HaffGil Cruz, Robbie LouisGaby, MD  PCP:  Roberts Gaudyoben, Jason M, MD    SUBJECTIVE:     Laurie Richard is a 72 years old female admitted on 09/07/17 for hypertensive urgency/crisis due to medical non compliance. Pt had BP of 220/140 on arrival. She was administered IV labetalol, hydralazine, lisinopril and clonidine.     8/29:  Pt reports feeling okay  No headache, nausea/vomiting, chills, blurry vision, chest/abdominal pain.    Review of Systems negative with exception of pertinent positives noted above      PHYSICAL EXAM       Visit Vitals  BP 137/77   Pulse 91   Temp 98.2 ??F (36.8 ??C)   Resp 18   Ht 5' (1.524 m)   Wt 71.7 kg (158 lb)   SpO2 100%   BMI 30.86 kg/m??      Temp (24hrs), Avg:98.2 ??F (36.8 ??C), Min:98 ??F (36.7 ??C), Max:98.4 ??F (36.9 ??C)    Oxygen Therapy  O2 Sat (%): 100 % (09/08/17 0345)  Pulse via Oximetry: 90 beats per minute (09/07/17 2311)  O2 Device: Room air (09/07/17 1304)  No intake or output data in the 24 hours ending 09/08/17 0725       General: No acute distress.  Head:  Atraumatic Normocephalic.  Eyes:  PERRLA, EOMI, Anicteric.  ENT:  No discharges/lesions.  Lungs:  CTA Bilaterally.   CVS:  Regular rate and rhythm,?? No murmur, rub, or gallop, No JVD, No lower   extremity edema.  Abdomen: Soft, Non distended, Non tender, Positive bowel sounds.  MSK:  No deformities, lesions, Spontaneously moves extremities.  Neurologic:?? AOx3. No focal deficits  Psychiatry:      No anxiety/Depression  Skin:   No rash/lesions. Good skin turgor  Heme/Lymph/Immune:  No petechiae, ecchymoses, overt signs of bleeding or    lymphadenopathy noted.    Recent Results (from the past 24 hour(s))   CBC WITH AUTOMATED DIFF    Collection Time: 09/07/17  1:07 PM   Result Value Ref Range    WBC 4.6 4.3 - 11.1 K/uL    RBC 5.65 (H) 4.05 - 5.2 M/uL    HGB 13.6 11.7 - 15.4 g/dL    HCT 40.942.9 81.135.8 -  91.446.3 %    MCV 75.9 (L) 79.6 - 97.8 FL    MCH 24.1 (L) 26.1 - 32.9 PG    MCHC 31.7 31.4 - 35.0 g/dL    RDW 78.216.5 (H) 95.611.9 - 14.6 %    PLATELET 171 150 - 450 K/uL    MPV 11.7 9.4 - 12.3 FL    ABSOLUTE NRBC 0.00 0.0 - 0.2 K/uL    DF AUTOMATED      NEUTROPHILS 46 43 - 78 %    LYMPHOCYTES 44 13 - 44 %    MONOCYTES 6 4.0 - 12.0 %    EOSINOPHILS 3 0.5 - 7.8 %    BASOPHILS 1 0.0 - 2.0 %    IMMATURE GRANULOCYTES 0 0.0 - 5.0 %    ABS. NEUTROPHILS 2.1 1.7 - 8.2 K/UL    ABS. LYMPHOCYTES 2.0 0.5 - 4.6 K/UL    ABS. MONOCYTES 0.3 0.1 - 1.3 K/UL    ABS. EOSINOPHILS 0.1 0.0 - 0.8 K/UL    ABS. BASOPHILS 0.1 0.0 - 0.2 K/UL  ABS. IMM. GRANS. 0.0 0.0 - 0.5 K/UL   METABOLIC PANEL, BASIC    Collection Time: September 23, 2017  1:07 PM   Result Value Ref Range    Sodium 142 136 - 145 mmol/L    Potassium 3.7 3.5 - 5.1 mmol/L    Chloride 108 (H) 98 - 107 mmol/L    CO2 27 21 - 32 mmol/L    Anion gap 7 7 - 16 mmol/L    Glucose 86 65 - 100 mg/dL    BUN 13 8 - 23 MG/DL    Creatinine 1.61 (H) 0.6 - 1.0 MG/DL    GFR est AA >09 >60 AV/WUJ/8.11B1    GFR est non-AA 50 (L) >60 ml/min/1.48m2    Calcium 9.2 8.3 - 10.4 MG/DL   EKG, 12 LEAD, INITIAL    Collection Time: 09-23-17  1:52 PM   Result Value Ref Range    Ventricular Rate 70 BPM    Atrial Rate 70 BPM    P-R Interval 148 ms    QRS Duration 66 ms    Q-T Interval 392 ms    QTC Calculation (Bezet) 423 ms    Calculated P Axis 64 degrees    Calculated R Axis 23 degrees    Calculated T Axis 36 degrees    Diagnosis       !! AGE AND GENDER SPECIFIC ECG ANALYSIS !!  Sinus rhythm with occasional Premature ventricular complexes  Possible Left atrial enlargement  Borderline ECG  No previous ECGs available  Confirmed by BITTRICK  MD ([facility]), Karie Kirks [phone]) on 09/23/2017 4:39:07 PM           Imaging [place] /Studies   All diagnostic imaging personally reviewed by me.    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      DVT Prophylaxis: LOVENOX  Dispo: likely discharge tomorrow to home    Daneil Dan, MD

## 2017-09-08 NOTE — Progress Notes (Signed)
OCCUPATIONAL THERAPY: Initial Assessment and Discharge 09/08/2017  INPATIENT:    Payor: [employer] / [place] [employer] [place] / Product Type: Managed Care Medicare /      NAME/AGE/GENDER: Laurie Richard is a 72 y.o. female   PRIMARY DIAGNOSIS:  Hypertensive urgency [I16.0]  Hypertensive crisis [I16.9] Hypertensive urgency   Hypertensive urgency          ICD-10: Treatment Diagnosis:    Generalized Muscle Weakness (M62.81)   Precautions/Allergies:     Opioids - morphine analogues and Peanut      ASSESSMENT:     Laurie Richard was admitted with hypertension. Pt lives alone and is independent at baseline, works part time as a Lawyer. This session, pt demonstrated independence with ADLs and with mobility for ADLs. UE assessment WNL. Pt did not demonstrate any functional deficits and does not require further skilled OT services at this time. NO d/c needs.       REHAB RECOMMENDATIONS (at time of discharge pending progress):    Placement:  It is my opinion, based on this patient's performance to date, that Laurie Richard may benefit from being discharged with NO further skilled therapy due to a proven ability to function at baseline.  Equipment:   None at this time              OCCUPATIONAL PROFILE AND HISTORY:   History of Present Injury/Illness (Reason for Referral):  See H&P  Past Medical History/Comorbidities:   Laurie Richard  has a past medical history of Hypertension (50'S ?) and Noncompliance.  Laurie Richard  has a past surgical history that includes hx ankle fracture tx (2019); hx hysterectomy; hx tonsillectomy; and hx bilateral salpingo-oophorectomy.  Social History/Living Environment:   Home Environment: Private residence  # Steps to Enter: 3  One/Two Story Residence: One story  Living Alone: Yes  Support Systems: Family member(s), Friends \\ neighbors  Patient Expects to be Discharged to:: Private residence  Current DME Used/Available at Home: None  Tub or Shower Type: Tub/Shower combination   Prior Level of Function/Work/Activity:  Independent, works, drives, lives alone     Number of Personal Factors/Comorbidities that affect the Plan of Care: Brief history (0):  LOW COMPLEXITY   ASSESSMENT OF OCCUPATIONAL PERFORMANCE::   Activities of Daily Living:   Basic ADLs (From Assessment) Complex ADLs (From Assessment)   Feeding: Independent  Oral Facial Hygiene/Grooming: Independent  Bathing: Independent  Upper Body Dressing: Independent  Lower Body Dressing: Independent  Toileting: Independent Instrumental ADL  Meal Preparation: Independent  Homemaking: Independent  Medication Management: Independent  Financial Management: Independent   Grooming/Bathing/Dressing Activities of Daily Living     Cognitive Retraining  Safety/Judgement: Awareness of environment;Fall prevention                 Functional Transfers  Bathroom Mobility: Independent     Bed/Mat Mobility  Supine to Sit: Independent  Sit to Supine: Independent  Sit to Stand: Independent  Stand to Sit: Independent  Bed to Chair: Independent  Scooting: Independent     Most Recent Physical Functioning:   Gross Assessment:                  Posture:  Posture (WDL): Within defined limits  Balance:  Sitting: Intact  Standing: Intact Bed Mobility:  Supine to Sit: Independent  Sit to Supine: Independent  Scooting: Independent  Wheelchair Mobility:     Transfers:  Sit to Stand: Independent  Stand to Sit: Independent  Bed to Chair: Independent  Patient Vitals for the past 6 hrs:   BP SpO2 Pulse   09/08/17 1200 (!) 133/96 99 % 93       Mental Status  Neurologic State: Alert  Orientation Level: Oriented X4  Cognition: Appropriate decision making, Follows commands  Perception: Appears intact  Perseveration: No perseveration noted  Safety/Judgement: Awareness of environment, Fall prevention                          Physical Skills Involved:  none  Cognitive Skills Affected (resulting in the inability to perform in a timely and safe manner):   none  Psychosocial Skills Affected:  none    Number of elements that affect the Plan of Care: 1-3:  LOW COMPLEXITY   CLINICAL DECISION MAKING:   [employer] AM-PAC??? ???6 Clicks???   Daily Activity Inpatient Short Form  How much help from another person does the patient currently need... Total A Lot A Little None   1.  Putting on and taking off regular lower body clothing?   ? 1   ? 2   ? 3   ? 4   2.  Bathing (including washing, rinsing, drying)?   ? 1   ? 2   ? 3   ? 4   3.  Toileting, which includes using toilet, bedpan or urinal?   ? 1   ? 2   ? 3   ? 4   4.  Putting on and taking off regular upper body clothing?   ? 1   ? 2   ? 3   ? 4   5.  Taking care of personal grooming such as brushing teeth?   ? 1   ? 2   ? 3   ? 4   6.  Eating meals?   ? 1   ? 2   ? 3   ? 4   ?? 2007, [employer], under license to [employer]. All rights reserved      Score:  Initial: 24 Most Recent: X (Date: -- )    Interpretation of Tool:  Represents activities that are increasingly more difficult (i.e. Bed mobility, Transfers, Gait).       Use of outcome tool(s) and clinical judgement create a POC that gives a: LOW COMPLEXITY         TREATMENT:   (In addition to Assessment/Re-Assessment sessions the following treatments were rendered)     Pre-treatment Symptoms/Complaints:    Pain: Initial:   Pain Intensity 1: 0  Post Session:  0     Assessment/Reassessment only, no treatment provided today    Braces/Orthotics/Lines/Etc:   O2 Device: Room air  Treatment/Session Assessment:    Response to Treatment:  no adverse reaction   Interdisciplinary Collaboration:   Scientist, product/process developmentccupational Therapist  Registered Nurse  After treatment position/precautions:   Supine in bed  Bed/Chair-wheels locked  Call light within reach  RN notified     Total Treatment Duration:  OT Patient Time In/Time Out  Time In: 1352  Time Out: 1401     Gordy CouncilmanNatalie S Edelkamp, OT

## 2017-09-08 NOTE — Progress Notes (Signed)
SBAR report received from Kayla, RN. Pt is stable. Alert and oriented. Calm and cooperative. Pt is resting quietly in bed. Respirations even and unlabored. Safety measures in place, bed low and locked, call light in reach. Encouraged to call with any needs.

## 2017-09-08 NOTE — Progress Notes (Signed)
PHYSICAL THERAPY: Initial Assessment, Discharge and AM 09/08/2017  INPATIENT:  eval only  Payor: [employer] / [place] [employer] / Product Type: Managed Care Medicare /       NAME/AGE/GENDER: Laurie Richard is a 72 y.o. female   PRIMARY DIAGNOSIS: Hypertensive urgency [I16.0]  Hypertensive crisis [I16.9] Hypertensive urgency   Hypertensive urgency     ICD-10: Treatment Diagnosis:    Difficulty in walking, Not elsewhere classified (R26.2)   Precaution/Allergies:  Opioids - morphine analogues and Peanut      ASSESSMENT:     Laurie Richard is a pleasant 72 y.o female presenting to hospital from MD office with extreme HTN. Pt A & Ox 4 this date, on room air. Pt states no pain, feels better. Pt reports lives alone in one story home, 2-3 steps to enter. Pt states independent at baseline for all activity, no falls, drives. Pt today standing in room on arrival, friend present. Pt demo independence with transfers. Pt ambulated into hallway without assistive device for 250', SBA to Supervision, no LOB with activity. Pt seated in chair, MMT B LE grossly 5/5 in strength. Pt with no needs at this time, will discharge from PT. Please re-order if medical status changes during this admission.     This section established at most recent assessment   PROBLEM LIST (Impairments causing functional limitations):  discharge    INTERVENTIONS PLANNED: (Benefits and precautions of physical therapy have been discussed with the patient.)  discharge      TREATMENT PLAN: Frequency/Duration: discharge  Rehabilitation Potential For Stated Goals: Excellent     REHAB RECOMMENDATIONS (at time of discharge pending progress):    Placement:  It is my opinion, based on this patient's performance to date, that Laurie Richard may benefit from being discharged with NO further skilled therapy due to a proven ability to function at baseline.  Equipment:   None at this time              HISTORY:   History of Present Injury/Illness (Reason for  Referral):  Patient is an alert, oriented 72 y.o. African American female who presents to ER this afternoon from [facility] office where she was found with blood pressure of 220/140.  No chest pain, headache, visual change, neuro symptoms.  This is not a new problem, old records review indicates she has been seen multiple times, mostly in the ER,  since at least 2014 for the same issue with extensive history of noncompliance.  She informs me she just needs a little lemon juice for her blood pressure.  Does not listen to reasons.  Administered labetalol, IVP, hydralazine IV, oral lisinopril and clonidine patch 0.2 mg without subsequent lowering of blood pressure.  Seems perplexed at blood pressure resistance. Dr Joellyn Haff in for exam, will  Have to begin Cardene drip and admit to critical care unit for closer monitoring.  Of note, PHP note from earlier today indicates patient was unable to draw the face of a clock correctly.  May have early dementia, needs complete dementia workup on discharge. Additional history below.  Denies chest pain or SOB.  Noted frequent ventricular beats and intermittent bigeminy.  Reports recent palpitations, has not mentioned to PHP.  Denies syncope or near syncope or feeling as if heart is stopping.  Denies past cardiology workup.     Past Medical History/Comorbidities:   Laurie Richard  has a past medical history of Hypertension (50'S ?) and Noncompliance.  Laurie Richard  has a past  surgical history that includes hx ankle fracture tx (2019); hx hysterectomy; hx tonsillectomy; and hx bilateral salpingo-oophorectomy.  Social History/Living Environment:   Home Environment: Private residence  # Steps to Enter: 3  One/Two Story Residence: One story  Living Alone: Yes  Support Systems: Friends \ neighbors  Patient Expects to be Discharged to:: Private residence  Current DME Used/Available at Home: None  Tub or Shower Type: Tub/Shower combination  Prior Level of Function/Work/Activity:  Lives alone,  independent at baseline, drives  Personal Factors:          Sex:  female        Age:  72 y.o.        Overall Behavior:  agreeable    Number of Personal Factors/Comorbidities that affect the Plan of Care:  Age, HTN 1-2: MODERATE COMPLEXITY   EXAMINATION:   Most Recent Physical Functioning:   Gross Assessment:  AROM: Within functional limits(B LE)  Strength: Within functional limits(B LE grossly 5/5)  Coordination: Within functional limits               Posture:  Posture (WDL): Within defined limits  Balance:  Sitting: Intact  Standing: Intact Bed Mobility:  Supine to Sit: (up in room on arrival)  Sit to Supine: (left up in chair)  Scooting: Independent  Wheelchair Mobility:     Transfers:  Sit to Stand: Independent  Stand to Sit: Independent  Gait:            Body Structures Involved:  Muscles Body Functions Affected:  Movement Related Activities and Participation Affected:  General Tasks and Demands   Number of elements that affect the Plan of Care: 3: MODERATE COMPLEXITY   CLINICAL PRESENTATION:   Presentation: Stable and uncomplicated: LOW COMPLEXITY   CLINICAL DECISION MAKING:   [employer] AM-PACT "6 Clicks"   Basic Mobility Inpatient Short Form  How much difficulty does the patient currently have... Unable A Lot A Little None   1.  Turning over in bed (including adjusting bedclothes, sheets and blankets)?   ? 1   ? 2   ? 3   ? 4   2.  Sitting down on and standing up from a chair with arms ( e.g., wheelchair, bedside commode, etc.)   ? 1   ? 2   ? 3   ? 4   3.  Moving from lying on back to sitting on the side of the bed?   ? 1   ? 2   ? 3   ? 4   How much help from another person does the patient currently need... Total A Lot A Little None   4.  Moving to and from a bed to a chair (including a wheelchair)?   ? 1   ? 2   ? 3   ? 4   5.  Need to walk in hospital room?   ? 1   ? 2   ? 3   ? 4   6.  Climbing 3-5 steps with a railing?   ? 1   ? 2   ? 3   ? 4    2007, Trustees of [address], under  license to [place], [place]. All rights reserved      Score:  Initial: 24 Most Recent: X (Date: -- )    Interpretation of Tool:  Represents activities that are increasingly more difficult (i.e. Bed mobility, Transfers, Gait).    Medical Necessity:     Discharge, no  skilled need at this time.  Reason for Services/Other Comments:  discharge.   Use of outcome tool(s) and clinical judgement create a POC that gives a: Clear prediction of patient's progress: LOW COMPLEXITY            TREATMENT:   (In addition to Assessment/Re-Assessment sessions the following treatments were rendered)   Pre-treatment Symptoms/Complaints:  "I am doing much better"  Pain: Initial:   Pain Intensity 1: 0  Post Session:  0/10     Assessment/Reassessment only, no treatment provided today    Braces/Orthotics/Lines/Etc:   O2 Device: Room air  Treatment/Session Assessment:    Response to Treatment:  tolerated well  Interdisciplinary Collaboration:   Physical Therapist  Registered Nurse  After treatment position/precautions:   Up in chair  Bed/Chair-wheels locked  Bed in low position  Call light within reach  Visitors at bedside   Compliance with Program/Exercises: discharge   Recommendations/Intent for next treatment session:  discharge.  Total Treatment Duration:  PT Patient Time In/Time Out  Time In: 1156  Time Out: 1208    Wynn Banker, PT

## 2017-09-08 NOTE — Progress Notes (Signed)
 Patient states she feel wobbly on her feet.  Patient states that its because she has not eaten in 2 days  BP 147/61  Pulse 65  O2 sat 97%

## 2017-09-08 NOTE — Progress Notes (Signed)
Patient admitted overnight. She presented from her PCP's office with hypertensive urgency. She is independent at baseline. Case Management will follow for discharge planning needs.    Care Management Interventions  PCP Verified by CM: Yes(Coben)  Transition of Care Consult (CM Consult): Discharge Planning  Discharge Durable Medical Equipment: No  Physical Therapy Consult: Yes  Occupational Therapy Consult: Yes  Speech Therapy Consult: No  Current Support Network: Own Home  Confirm Follow Up Transport: Self  Plan discussed with Pt/Family/Caregiver: Yes  Freedom of Choice Offered: Yes  Discharge Location  Discharge Placement: Home

## 2017-09-08 NOTE — Progress Notes (Signed)
Patient states she feel "wobbly" on her feet.  Patient states that its because she has not eaten in 2 days  BP 147/61  Pulse 65  O2 sat 97%

## 2017-09-08 NOTE — Progress Notes (Signed)
Hospitalist Progress Note    09/08/2017  Admit Date: 09/07/2017  1:29 PM   NAME: Laurie Richard   DOB:  06-14-1945   MRN:  [id number]   Attending: Joellyn Haff, Robbie Louis, MD  PCP:  Roberts Gaudy, MD    SUBJECTIVE:     Laurie Richard is a 72 years old female admitted on 09/07/17 for hypertensive urgency/crisis due to medical non compliance. Pt had BP of 220/140 on arrival. She was administered IV labetalol, hydralazine, lisinopril and clonidine.     8/29:  Pt reports feeling okay  No headache, nausea/vomiting, chills, blurry vision, chest/abdominal pain.    Review of Systems negative with exception of pertinent positives noted above      PHYSICAL EXAM       Visit Vitals  BP 137/77   Pulse 91   Temp 98.2 ??F (36.8 ??C)   Resp 18   Ht 5' (1.524 m)   Wt 71.7 kg (158 lb)   SpO2 100%   BMI 30.86 kg/m??      Temp (24hrs), Avg:98.2 ??F (36.8 ??C), Min:98 ??F (36.7 ??C), Max:98.4 ??F (36.9 ??C)    Oxygen Therapy  O2 Sat (%): 100 % (09/08/17 0345)  Pulse via Oximetry: 90 beats per minute (09/07/17 2311)  O2 Device: Room air (09/07/17 1304)  No intake or output data in the 24 hours ending 09/08/17 0725       General: No acute distress.  Head:  Atraumatic Normocephalic.  Eyes:  PERRLA, EOMI, Anicteric.  ENT:  No discharges/lesions.  Lungs:  CTA Bilaterally.   CVS:  Regular rate and rhythm,?? No murmur, rub, or gallop, No JVD, No lower   extremity edema.  Abdomen: Soft, Non distended, Non tender, Positive bowel sounds.  MSK:  No deformities, lesions, Spontaneously moves extremities.  Neurologic:?? AOx3. No focal deficits  Psychiatry:      No anxiety/Depression  Skin:   No rash/lesions. Good skin turgor  Heme/Lymph/Immune:  No petechiae, ecchymoses, overt signs of bleeding or    lymphadenopathy noted.    Recent Results (from the past 24 hour(s))   CBC WITH AUTOMATED DIFF    Collection Time: 09/07/17  1:07 PM   Result Value Ref Range    WBC 4.6 4.3 - 11.1 K/uL    RBC 5.65 (H) 4.05 - 5.2 M/uL    HGB 13.6 11.7 - 15.4 g/dL     HCT 40.9 81.1 - 91.4 %    MCV 75.9 (L) 79.6 - 97.8 FL    MCH 24.1 (L) 26.1 - 32.9 PG    MCHC 31.7 31.4 - 35.0 g/dL    RDW 78.2 (H) 95.6 - 14.6 %    PLATELET 171 150 - 450 K/uL    MPV 11.7 9.4 - 12.3 FL    ABSOLUTE NRBC 0.00 0.0 - 0.2 K/uL    DF AUTOMATED      NEUTROPHILS 46 43 - 78 %    LYMPHOCYTES 44 13 - 44 %    MONOCYTES 6 4.0 - 12.0 %    EOSINOPHILS 3 0.5 - 7.8 %    BASOPHILS 1 0.0 - 2.0 %    IMMATURE GRANULOCYTES 0 0.0 - 5.0 %    ABS. NEUTROPHILS 2.1 1.7 - 8.2 K/UL    ABS. LYMPHOCYTES 2.0 0.5 - 4.6 K/UL    ABS. MONOCYTES 0.3 0.1 - 1.3 K/UL    ABS. EOSINOPHILS 0.1 0.0 - 0.8 K/UL    ABS. BASOPHILS 0.1 0.0 - 0.2 K/UL  ABS. IMM. GRANS. 0.0 0.0 - 0.5 K/UL   METABOLIC PANEL, BASIC    Collection Time: 09/07/17  1:07 PM   Result Value Ref Range    Sodium 142 136 - 145 mmol/L    Potassium 3.7 3.5 - 5.1 mmol/L    Chloride 108 (H) 98 - 107 mmol/L    CO2 27 21 - 32 mmol/L    Anion gap 7 7 - 16 mmol/L    Glucose 86 65 - 100 mg/dL    BUN 13 8 - 23 MG/DL    Creatinine 1.611.13 (H) 0.6 - 1.0 MG/DL    GFR est AA >09>60 >60>60 AV/WUJ/8.11B1ml/min/1.73m2    GFR est non-AA 50 (L) >60 ml/min/1.4573m2    Calcium 9.2 8.3 - 10.4 MG/DL   EKG, 12 LEAD, INITIAL    Collection Time: 09/07/17  1:52 PM   Result Value Ref Range    Ventricular Rate 70 BPM    Atrial Rate 70 BPM    P-R Interval 148 ms    QRS Duration 66 ms    Q-T Interval 392 ms    QTC Calculation (Bezet) 423 ms    Calculated P Axis 64 degrees    Calculated R Axis 23 degrees    Calculated T Axis 36 degrees    Diagnosis       !! AGE AND GENDER SPECIFIC ECG ANALYSIS !!  Sinus rhythm with occasional Premature ventricular complexes  Possible Left atrial enlargement  Borderline ECG  No previous ECGs available  Confirmed by BITTRICK  MD (UC), Karie KirksJON M [phone]) on 09/07/2017 4:39:07 PM           Imaging [place]/Procedures /Studies   All diagnostic imaging personally reviewed by me.    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      DVT Prophylaxis: LOVENOX  Dispo: likely discharge tomorrow to home    Daneil DanLakshya Esra Frankowski, MD

## 2017-09-08 NOTE — Progress Notes (Signed)
PT note: PT evaluation received and chart reviewed. Pt off floor for ultrasound. Will attempt later as time and schedule allows.   Wynn BankerKathleen D Brooks, PT  09/08/2017

## 2017-09-08 NOTE — Progress Notes (Signed)
Beside shift report received from Abbey RN  Patient lying in bed  Respirations present  No signs of distress  No needs expressed at this time  Safety measures in place

## 2017-09-08 NOTE — Progress Notes (Signed)
PT note: PT evaluation received and chart reviewed. Pt off floor for ultrasound. Will attempt later as time and schedule allows.   Kathleen D Brooks, PT  09/08/2017

## 2017-09-08 NOTE — Progress Notes (Signed)
Spiritual Care Visit, initial visit.    Visited with patient at bedside.    Prayed for patient's healing and health.    Visit by Donald E. Watson, Staff Chaplain. M.Ed., Th.B., B.A.

## 2017-09-08 NOTE — Progress Notes (Signed)
 OCCUPATIONAL THERAPY: Initial Assessment and Discharge 09/08/2017  INPATIENT:    Payor: [employer] / [place] [employer] / Product Type: Managed Care Medicare /      NAME/AGE/GENDER: Laurie Richard is a 72 y.o. female   PRIMARY DIAGNOSIS:  Hypertensive urgency [I16.0]  Hypertensive crisis [I16.9] Hypertensive urgency   Hypertensive urgency          ICD-10: Treatment Diagnosis:    Generalized Muscle Weakness (M62.81)   Precautions/Allergies:     Opioids - morphine analogues and Peanut      ASSESSMENT:     Ms. Welcome was admitted with hypertension. Pt lives alone and is independent at baseline, works part time as a Lawyer. This session, pt demonstrated independence with ADLs and with mobility for ADLs. UE assessment WNL. Pt did not demonstrate any functional deficits and does not require further skilled OT services at this time. NO d/c needs.       REHAB RECOMMENDATIONS (at time of discharge pending progress):    Placement:  It is my opinion, based on this patient's performance to date, that Ms. Brodersen may benefit from being discharged with NO further skilled therapy due to a proven ability to function at baseline.  Equipment:   None at this time              OCCUPATIONAL PROFILE AND HISTORY:   History of Present Injury/Illness (Reason for Referral):  See H&P  Past Medical History/Comorbidities:   Ms. Patnaude  has a past medical history of Hypertension (50'S ?) and Noncompliance.  Ms. Deangelo  has a past surgical history that includes hx ankle fracture tx (2019); hx hysterectomy; hx tonsillectomy; and hx bilateral salpingo-oophorectomy.  Social History/Living Environment:   Home Environment: Private residence  # Steps to Enter: 3  One/Two Story Residence: One story  Living Alone: Yes  Support Systems: Family member(s), Friends \ neighbors  Patient Expects to be Discharged to:: Private residence  Current DME Used/Available at Home: None  Tub or Shower Type: Tub/Shower combination  Prior Level of  Function/Work/Activity:  Independent, works, drives, lives alone     Number of Personal Factors/Comorbidities that affect the Plan of Care: Brief history (0):  LOW COMPLEXITY   ASSESSMENT OF OCCUPATIONAL PERFORMANCE::   Activities of Daily Living:   Basic ADLs (From Assessment) Complex ADLs (From Assessment)   Feeding: Independent  Oral Facial Hygiene/Grooming: Independent  Bathing: Independent  Upper Body Dressing: Independent  Lower Body Dressing: Independent  Toileting: Independent Instrumental ADL  Meal Preparation: Independent  Homemaking: Independent  Medication Management: Independent  Financial Management: Independent   Grooming/Bathing/Dressing Activities of Daily Living     Cognitive Retraining  Safety/Judgement: Awareness of environment;Fall prevention                 Functional Transfers  Bathroom Mobility: Independent     Bed/Mat Mobility  Supine to Sit: Independent  Sit to Supine: Independent  Sit to Stand: Independent  Stand to Sit: Independent  Bed to Chair: Independent  Scooting: Independent     Most Recent Physical Functioning:   Gross Assessment:                  Posture:  Posture (WDL): Within defined limits  Balance:  Sitting: Intact  Standing: Intact Bed Mobility:  Supine to Sit: Independent  Sit to Supine: Independent  Scooting: Independent  Wheelchair Mobility:     Transfers:  Sit to Stand: Independent  Stand to Sit: Independent  Bed to Chair: Independent  Patient Vitals for the past 6 hrs:   BP SpO2 Pulse   09/08/17 1200 (!) 133/96 99 % 93       Mental Status  Neurologic State: Alert  Orientation Level: Oriented X4  Cognition: Appropriate decision making, Follows commands  Perception: Appears intact  Perseveration: No perseveration noted  Safety/Judgement: Awareness of environment, Fall prevention                          Physical Skills Involved:  none  Cognitive Skills Affected (resulting in the inability to perform in a timely and safe manner):  none  Psychosocial Skills  Affected:  none    Number of elements that affect the Plan of Care: 1-3:  LOW COMPLEXITY   CLINICAL DECISION MAKING:   [employer] AM-PACT "6 Clicks"   Daily Activity Inpatient Short Form  How much help from another person does the patient currently need... Total A Lot A Little None   1.  Putting on and taking off regular lower body clothing?   ? 1   ? 2   ? 3   ? 4   2.  Bathing (including washing, rinsing, drying)?   ? 1   ? 2   ? 3   ? 4   3.  Toileting, which includes using toilet, bedpan or urinal?   ? 1   ? 2   ? 3   ? 4   4.  Putting on and taking off regular upper body clothing?   ? 1   ? 2   ? 3   ? 4   5.  Taking care of personal grooming such as brushing teeth?   ? 1   ? 2   ? 3   ? 4   6.  Eating meals?   ? 1   ? 2   ? 3   ? 4    2007, Trustees of [address], under license to [place], [place]. All rights reserved      Score:  Initial: 24 Most Recent: X (Date: -- )    Interpretation of Tool:  Represents activities that are increasingly more difficult (i.e. Bed mobility, Transfers, Gait).       Use of outcome tool(s) and clinical judgement create a POC that gives a: LOW COMPLEXITY         TREATMENT:   (In addition to Assessment/Re-Assessment sessions the following treatments were rendered)     Pre-treatment Symptoms/Complaints:    Pain: Initial:   Pain Intensity 1: 0  Post Session:  0     Assessment/Reassessment only, no treatment provided today    Braces/Orthotics/Lines/Etc:   O2 Device: Room air  Treatment/Session Assessment:    Response to Treatment:  no adverse reaction   Interdisciplinary Collaboration:   Scientist, product/process development  After treatment position/precautions:   Supine in bed  Bed/Chair-wheels locked  Call light within reach  RN notified     Total Treatment Duration:  OT Patient Time In/Time Out  Time In: 1352  Time Out: 1401     Laneta GORMAN Crew, OT

## 2017-09-08 NOTE — Progress Notes (Signed)
Patient received to room 818 from ultrasound

## 2017-09-08 NOTE — Progress Notes (Signed)
 TRANSFER - IN REPORT:    Verbal report received from  ER RN on Laurie Richard  being received from ED(unit) for routine progression of care      Report consisted of patient's Situation, Background, Assessment and   Recommendations(SBAR).     Information from the following report(s) SBAR was reviewed with the receiving nurse.    Opportunity for questions and clarification was provided.      Assessment completed upon patient's arrival to unit and care assumed.

## 2017-09-08 NOTE — Progress Notes (Signed)
Spiritual Care Visit, initial visit.    Visited with patient at bedside.    Prayed for patient's healing and health.    Visit by Donald E. Watson, Staff Chaplain. M.Ed., Th.B., B.A.

## 2017-09-09 LAB — METABOLIC PANEL, BASIC
Anion gap: 8 mmol/L (ref 7–16)
BUN: 20 MG/DL (ref 8–23)
CO2: 24 mmol/L (ref 21–32)
Calcium: 8.9 MG/DL (ref 8.3–10.4)
Chloride: 106 mmol/L (ref 98–107)
Creatinine: 1.24 MG/DL — ABNORMAL HIGH (ref 0.6–1.0)
GFR est AA: 55 mL/min/{1.73_m2} — ABNORMAL LOW (ref >60)
GFR est non-AA: 45 mL/min/{1.73_m2} — ABNORMAL LOW (ref >60)
Glucose: 68 mg/dL (ref 65–100)
Potassium: 3.4 mmol/L — ABNORMAL LOW (ref 3.5–5.1)
Sodium: 138 mmol/L (ref 136–145)

## 2017-09-09 LAB — GLUCOSE, POC: Glucose (POC): 81 mg/dL (ref 65–100)

## 2017-09-09 LAB — BASIC METABOLIC PANEL
Anion Gap: 8 mmol/L (ref 7–16)
BUN: 20 MG/DL (ref 8–23)
CO2: 24 mmol/L (ref 21–32)
Calcium: 8.9 MG/DL (ref 8.3–10.4)
Chloride: 106 mmol/L (ref 98–107)
Creatinine: 1.24 MG/DL — ABNORMAL HIGH (ref 0.6–1.0)
EGFR IF NonAfrican American: 45 mL/min/{1.73_m2} — ABNORMAL LOW (ref >60)
GFR African American: 55 mL/min/{1.73_m2} — ABNORMAL LOW (ref >60)
Glucose: 68 mg/dL (ref 65–100)
Potassium: 3.4 mmol/L — ABNORMAL LOW (ref 3.5–5.1)
Sodium: 138 mmol/L (ref 136–145)

## 2017-09-09 LAB — POCT GLUCOSE: POC Glucose: 81 mg/dL (ref 65–100)

## 2017-09-09 MED ORDER — HYDRALAZINE 25 MG TAB
25 mg | Freq: Three times a day (TID) | ORAL | Status: DC
Start: 2017-09-09 — End: 2017-09-09
  Administered 2017-09-09: 13:00:00 via ORAL

## 2017-09-09 MED ORDER — AMLODIPINE 5 MG TAB
5 mg | ORAL_TABLET | Freq: Every day | ORAL | 2 refills | Status: AC
Start: 2017-09-09 — End: 2017-10-09

## 2017-09-09 MED ORDER — HYDRALAZINE 25 MG TAB
25 mg | ORAL_TABLET | Freq: Three times a day (TID) | ORAL | 2 refills | Status: DC
Start: 2017-09-09 — End: 2017-09-15

## 2017-09-09 MED ORDER — ASPIRIN 81 MG CHEWABLE TAB
81 mg | ORAL_TABLET | Freq: Every day | ORAL | 2 refills | Status: AC
Start: 2017-09-09 — End: 2017-10-09

## 2017-09-09 MED ORDER — AMLODIPINE 5 MG TAB
5 mg | Freq: Every day | ORAL | Status: DC
Start: 2017-09-09 — End: 2017-09-09
  Administered 2017-09-09: 13:00:00 via ORAL

## 2017-09-09 MED FILL — LOVENOX 40 MG/0.4 ML SUBCUTANEOUS SYRINGE: 40 mg/0.4 mL | SUBCUTANEOUS | Qty: 0.4

## 2017-09-09 MED FILL — HYDRALAZINE 25 MG TAB: 25 mg | ORAL | Qty: 1

## 2017-09-09 MED FILL — ASPIRIN 81 MG CHEWABLE TAB: 81 mg | ORAL | Qty: 1

## 2017-09-09 MED FILL — LISINOPRIL 20 MG TAB: 20 mg | ORAL | Qty: 1

## 2017-09-09 MED FILL — HYDRALAZINE 20 MG/ML IJ SOLN: 20 mg/mL | INTRAMUSCULAR | Qty: 1

## 2017-09-09 MED FILL — AMLODIPINE 5 MG TAB: 5 mg | ORAL | Qty: 1

## 2017-09-09 NOTE — Progress Notes (Signed)
BP 153/73. Pt is stable resting quietly in bed.

## 2017-09-09 NOTE — Progress Notes (Signed)
Pt resting in bed with eyes closed at this time. Pt alert and oriented to person place and time. No distress noted at this time. Respirations even and unlabored on room air. 22 right hand IV clean dry and intact. No signs of infection noted at this time. Pt denies pain at this time. Pt instructed to call for assistance if needed. Call light in place. Door open. Will continue to monitor.

## 2017-09-09 NOTE — Progress Notes (Signed)
Discharge instructions, follow up information, medication list, and prescription information provided and explained to the pt.  IV removed by primary RN, remote telemetry removed.  Opportunity for questions provided. Patient states has already called ride.  Instructed to call once ready to leave.

## 2017-09-09 NOTE — Progress Notes (Signed)
PRN hydralazine given for BP 176/96 P 75

## 2017-09-09 NOTE — Progress Notes (Signed)
Discharge instructions, follow up information, medication list, and prescription information provided and explained to the pt.  IV removed by primary RN, remote telemetry removed.  Opportunity for questions provided. Patient states has already called ride.  Instructed to call once ready to leave.

## 2017-09-09 NOTE — Progress Notes (Signed)
Patient is discharging home today. She is independent in all ADLs. No discharge planning needs identified at this time. Case Management will remain available to assist as needed.    Care Management Interventions  PCP Verified by CM: Yes([place])  Transition of Care Consult (CM Consult): Discharge Planning  Discharge Durable Medical Equipment: No  Physical Therapy Consult: Yes  Occupational Therapy Consult: Yes  Speech Therapy Consult: No  Current Support Network: Own Home  Confirm Follow Up Transport: Self  Plan discussed with Pt/Family/Caregiver: Yes  Freedom of Choice Offered: Yes  Discharge Location  Discharge Placement: Home

## 2017-09-09 NOTE — Discharge Summary (Signed)
Discharge Summary by Daneil Dan, MD at 09/09/17 [phone]                Author: Daneil Dan, MD  Service: Internal Medicine  Author Type: Physician       Filed: 09/09/17 1226  Date of Service: 09/09/17 0755  Status: Signed          Editor: Daneil Dan, MD (Physician)                                   Hospitalist Discharge Summary        Patient ID:   Laurie Richard   [id number]   72 y.o.   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 In summary, the patient is 72 years old female admitted  on 09/07/17 for hypertensive urgency/crisis due to medical non compliance. Pt had BP of 220/140 on arrival. She was administered IV labetalol, hydralazine, lisinopril and clonidine.    Pt's blood pressure improved, and did not require ICU admission.   Pt's blood pressure is optimally controlled with lisinopril, norvasc and hydralazine.   Pt is advised to maintain good oral hydration and salt restricted diet.   She will follow up with PCP in 1 week. Non compliance has been addressed.   Pt is medically cleared and hemodynamically stable for discharge today. Rest of the hospital course was uneventful.      Significant Diagnostic Studies:    HISTORY: 72 years of age Female with hypertension.   ??   EXAM/TECHNIQUE: Duplex  bilateral renal ultrasound exam. Grayscale, Color, and   Spectral Doppler Interrogation was performed of the bilateral renal artery   (-ies).   ??   COMPARISON: None.Marland Kitchen   ??   RIGHT KIDNEY:    Maximal length = 9.0 cm.  Cortical thickness = 12 mm.  The cortical-medullary   interface is maintained.  No hydronephrosis.   ??   Resistive Index = 0.63, 0.50, and 0.58.  (Normal Range = 0.5 - 0.7)   Acceleration Time = 54 ms and 34 ms.  (Normal Range < 100 ms)   Acceleration Index = 819 and 2082 cm/sec-sec.  (Normal Range > 300 cm/s)   ??   Renal Artery Velocity = 126, 142, and 175 cm/sec.     Ratio = 1.9.  (Normal Range < 3.5)   Renal vein is patent.   ??   LEFT KIDNEY:    Maximal length = 9.1 cm.  Cortical thickness = 12 mm.  The cortical-medullary   interface is  maintained.  No hydronephrosis.   ??   Resistive Index = 0.54, 0.59, and 0.53.  (Normal Range = 0.5 - 0.7)   Acceleration Time = 34 ms and 27 ms.  (Normal Range < 100 ms)   Acceleration Index = 2 1 4 7  and 1059 cm/sec-sec.  (Normal Range > 300 cm/s)   ??   Renal Artery Velocity = 80, 84, and 94 cm/sec.     Ratio = 1.0.  (Normal Range < 3.5)   Renal vein is patent.   ??   ABDOMEN:    Aortic velocity = 91 cm/sec.    Inferior Vena Cava is patent.   ??   IMPRESSION   IMPRESSION:     Right renal artery: No evidence of hemodynamically significant stenosis.   ??   Left renal artery: No evidence of hemodynamically significant stenosis.      stenosis..   ??   EXAM/TECHNIQUE: Carotid and Vertebral Ultrasound Examination. Grayscale, Color   Doppler, and Spectral Doppler Ultrasound interrogation performed.  Peak Systolic   Velocity, ICA-to-CCA ratio, and End-Diastolic Velocity are recorded.  The   estimate of stenosis is inferred from velocity measurements cross referenced to   published correlations as defined by the Society of Radiologists in Ultrasound   Consensus Conference Radiology 2003; 229; 340-346.     ??   COMPARISON: None..   ??   FINDINGS:   Peak systolic Velocities (in  cm/s):   Right Common Carotid Artery: 78   Right Internal Carotid Artery: 101    Ratio = 1.3.     ??   Left Common Carotid Artery: 72   Left Internal Carotid Artery: 103    Ratio = 1.4.     ??   There are overall triphasic to biphasic waveforms in the right common carotid   and internal carotid arteries.   There is mild atherosclerotic disease of the right common carotid artery, right   carotid bulb, and right ICA origin.Marland Kitchen.    Antegrade flow in the right vertebral artery.   ??   There are overall triphasic to biphasic waveforms in the left common carotid and   internal carotid arteries.   There is mild atherosclerotic disease of the left common carotid artery, left   carotid bulb, and left ICA origin.   Antegrade flow in the left vertebral artery.   ??   IMPRESSION   IMPRESSION:     RICA:  No evidence of hemodynamically significant stenosis.    ??   LICA:  No evidence of hemodynamically significant stenosis.       CHEST X-RAY, 2 views.   ??   HISTORY:  Shortness breath with exertion.   ??   TECHNIQUE: PA and lateral views.   ??   COMPARISON: None.   ??   FINDINGS: The lungs are clear. The heart size is normal. The costophrenic angles   are sharp. The pulmonary vasculature is unremarkable. Included portion of the   upper abdomen is unremarkable.    ??   IMPRESSION   IMPRESSION: Negative for acute abnormality.   ??         Labs:  Results:                  Chemistry  Recent Labs         09/07/17   1307      GLU  86      NA  142      K  3.7  CL  108*      CO2  27      BUN  13      CREA  1.13*      CA  9.2      AGAP  7                 CBC w/Diff  Recent Labs         09/07/17   1307      WBC  4.6      RBC  5.65*      HGB  13.6      HCT  42.9      PLT  171      GRANS  46      LYMPH  44      EOS  3                 Cardiac Enzymes  No results for input(s): CPK, CKND1, MYO in the last 72 hours.      No lab exists for component: CKRMB, TROIP     Coagulation  No results for input(s): PTP, INR, APTT in the last 72 hours.      No lab  exists for component: INREXT         Lipid Panel  Lab Results      Component  Value  Date/Time        Cholesterol, total  254 (H)  09/08/2017 06:39 AM        HDL Cholesterol  113 (H)  40/98/1191 06:39 AM        LDL, calculated  128.4 (H)  09/08/2017 06:39 AM        VLDL, calculated  12.6  09/08/2017 06:39 AM        Triglyceride  63  09/08/2017 06:39 AM        CHOL/HDL Ratio  2.2  09/08/2017 06:39 AM                 BNP  No results for input(s): BNPP in the last 72 hours.     Liver Enzymes  No results for input(s): TP, ALB, TBIL, AP, SGOT, GPT in the last 72 hours.      No lab exists for component: DBIL        Thyroid Studies  Lab Results      Component  Value  Date/Time        TSH  3.000  09/08/2017 06:39 AM                      Discharge Exam:   Visit Vitals      BP  153/81     Pulse  79     Temp  98 ??F (36.7 ??C)     Resp  20     Ht  5' (1.524 m)     Wt  71.7 kg (158 lb)     SpO2  98%        BMI  30.86 kg/m??        General appearance: alert, cooperative, no distress, appears stated age   Lungs: clear to auscultation bilaterally   Heart: regular rate and rhythm, S1, S2 normal, no murmur, click, rub or gallop   Abdomen: soft, non-tender. Bowel sounds normal. No masses,  no organomegaly   Extremities: no cyanosis or edema   Neurologic: Grossly normal      Disposition: home   Discharge Condition: stable   Patient  Instructions:      Current Discharge Medication List              START taking these medications          Details        amLODIPine (NORVASC) 5 mg tablet  Take 1 Tab by mouth daily for 30 days.   Qty: 30 Tab, Refills:  2               aspirin 81 mg chewable tablet  Take 1 Tab by mouth daily for 30 days.   Qty: 30 Tab, Refills:  2               hydrALAZINE (APRESOLINE) 25 mg tablet  Take 1 Tab by mouth three (3) times daily for 30 days.   Qty: 90 Tab, Refills:  2                     CONTINUE these medications which have NOT CHANGED          Details        lisinopril (PRINIVIL, ZESTRIL) 20 mg tablet  Take 1 Tab  by mouth daily.   Qty: 90 Tab, Refills:  1          Associated Diagnoses: Essential hypertension                         Activity: Activity as tolerated   Diet: Cardiac Diet   Wound Care: None needed      Follow-up   ??  Follow up with PCP in 1 week.     Time spent to discharge patient 35 minutes   Signed:   Daneil Dan, MD   09/09/2017   7:55 AM

## 2017-09-09 NOTE — Discharge Summary (Signed)
Hospitalist Discharge Summary     Patient ID:  Laurie Richard  [id number]  72 y.o.  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 In summary, the patient is 72 years old female admitted on 09/07/17 for hypertensive urgency/crisis due to medical non compliance. Pt had BP of 220/140 on arrival. She was administered IV labetalol, hydralazine, lisinopril and clonidine.   Pt's blood pressure improved, and did not require ICU admission.  Pt's blood pressure is optimally controlled with lisinopril, norvasc and hydralazine.  Pt is advised to maintain good oral hydration and salt restricted diet.  She will follow up with PCP in 1 week. Non compliance has been addressed.  Pt is medically cleared and hemodynamically stable for discharge today. Rest of the hospital course was uneventful.    Significant Diagnostic Studies:   HISTORY: 72 years of age Female with hypertension.  ??  EXAM/TECHNIQUE: Duplex bilateral renal ultrasound exam. Grayscale, Color, and  Spectral Doppler Interrogation was performed of the bilateral renal artery  (-ies).  ??  COMPARISON: None.Marland Kitchen  ??  RIGHT KIDNEY:   Maximal length = 9.0 cm.  Cortical thickness = 12 mm.  The cortical-medullary  interface is maintained.  No hydronephrosis.  ??   Resistive Index = 0.63, 0.50, and 0.58.  (Normal Range = 0.5 - 0.7)  Acceleration Time = 54 ms and 34 ms.  (Normal Range < 100 ms)  Acceleration Index = 819 and 2082 cm/sec-sec.  (Normal Range > 300 cm/s)  ??  Renal Artery Velocity = 126, 142, and 175 cm/sec.    Ratio = 1.9.  (Normal Range < 3.5)  Renal vein is patent.  ??  LEFT KIDNEY:   Maximal length = 9.1 cm.  Cortical thickness = 12 mm.  The cortical-medullary  interface is maintained.  No hydronephrosis.  ??  Resistive Index = 0.54, 0.59, and 0.53.  (Normal Range = 0.5 - 0.7)  Acceleration Time = 34 ms and 27 ms.  (Normal Range < 100 ms)  Acceleration Index = 2 1 4 7  and 1059 cm/sec-sec.  (Normal Range > 300 cm/s)  ??  Renal Artery Velocity = 80, 84, and 94 cm/sec.    Ratio = 1.0.  (Normal Range < 3.5)  Renal vein is patent.  ??  ABDOMEN:   Aortic velocity = 91 cm/sec.   Inferior Vena Cava is patent.  ??  IMPRESSION  IMPRESSION:    Right renal artery: No evidence of hemodynamically significant stenosis.  ??  Left renal artery: No evidence of hemodynamically significant stenosis.    stenosis..  ??  EXAM/TECHNIQUE: Carotid and Vertebral Ultrasound Examination. Grayscale, Color  Doppler, and Spectral Doppler Ultrasound interrogation performed.  Peak Systolic  Velocity, ICA-to-CCA ratio, and End-Diastolic Velocity are recorded.  The  estimate of stenosis is inferred from velocity measurements cross referenced to  published correlations as defined by the Society of Radiologists in Ultrasound  Consensus Conference Radiology 2003; 229; 340-346.    ??  COMPARISON: None..  ??  FINDINGS:  Peak systolic Velocities (in cm/s):  Right Common Carotid Artery: 78  Right Internal Carotid Artery: 101   Ratio = 1.3.    ??  Left Common Carotid Artery: 72  Left Internal Carotid Artery: 103   Ratio = 1.4.    ??  There are overall triphasic to biphasic waveforms in the right common carotid  and internal carotid arteries.  There is mild atherosclerotic disease of the right common carotid artery,  right  carotid bulb, and right ICA origin.Marland Kitchen   Antegrade flow in the right vertebral artery.  ??  There are overall triphasic to biphasic waveforms in the left common carotid and  internal carotid arteries.  There is mild atherosclerotic disease of the left common carotid artery, left  carotid bulb, and left ICA origin.  Antegrade flow in the left vertebral artery.  ??  IMPRESSION  IMPRESSION:    RICA:  No evidence of hemodynamically significant stenosis.   ??  LICA:  No evidence of hemodynamically significant stenosis.     CHEST X-RAY, 2 views.  ??  HISTORY:  Shortness breath with exertion.  ??  TECHNIQUE: PA and lateral views.  ??  COMPARISON: None.  ??  FINDINGS: The lungs are clear. The heart size is normal. The costophrenic angles  are sharp. The pulmonary vasculature is unremarkable. Included portion of the  upper abdomen is unremarkable.   ??  IMPRESSION  IMPRESSION: Negative for acute abnormality.  ??    Labs: Results:       Chemistry Recent Labs     09/07/17  1307   GLU 86   NA 142   K 3.7   CL 108*   CO2 27   BUN 13   CREA 1.13*   CA 9.2   AGAP 7      CBC w/Diff Recent Labs     09/07/17  1307   WBC 4.6   RBC 5.65*   HGB 13.6   HCT 42.9   PLT 171   GRANS 46   LYMPH 44   EOS 3      Cardiac Enzymes No results for input(s): CPK, CKND1, MYO in the last 72 hours.    No lab exists for component: CKRMB, TROIP   Coagulation No results for input(s): PTP, INR, APTT in the last 72 hours.    No lab exists for component: INREXT    Lipid Panel Lab Results   Component Value Date/Time    Cholesterol, total 254 (H) 09/08/2017 06:39 AM    HDL Cholesterol 113 (H) 09/08/2017 06:39 AM    LDL, calculated 128.4 (H) 09/81/1914 06:39 AM    VLDL, calculated 12.6 09/08/2017 06:39 AM    Triglyceride 63 09/08/2017 06:39 AM    CHOL/HDL Ratio 2.2 09/08/2017 06:39 AM      BNP No results for input(s): BNPP in the last 72 hours.   Liver Enzymes No results for input(s): TP, ALB, TBIL, AP, SGOT, GPT in the last 72 hours.     No lab exists for component: DBIL   Thyroid Studies Lab Results   Component Value Date/Time    TSH 3.000 09/08/2017 06:39 AM            Discharge Exam:  Visit Vitals  BP  153/81   Pulse 79   Temp 98 ??F (36.7 ??C)   Resp 20   Ht 5' (1.524 m)   Wt 71.7 kg (158 lb)   SpO2 98%   BMI 30.86 kg/m??     General appearance: alert, cooperative, no distress, appears stated age  Lungs: clear to auscultation bilaterally  Heart: regular rate and rhythm, S1, S2 normal, no murmur, click, rub or gallop  Abdomen: soft, non-tender. Bowel sounds normal. No masses,  no organomegaly  Extremities: no cyanosis or edema  Neurologic: Grossly normal    Disposition: home  Discharge Condition: stable  Patient Instructions:   Current Discharge Medication List      START taking these medications    Details   amLODIPine (NORVASC) 5 mg tablet Take 1 Tab by mouth daily for 30 days.  Qty: 30 Tab, Refills: 2      aspirin 81 mg chewable tablet Take 1 Tab by mouth daily for 30 days.  Qty: 30 Tab, Refills: 2      hydrALAZINE (APRESOLINE) 25 mg tablet Take 1 Tab by mouth three (3) times daily for 30 days.  Qty: 90 Tab, Refills: 2         CONTINUE these medications which have NOT CHANGED    Details   lisinopril (PRINIVIL, ZESTRIL) 20 mg tablet Take 1 Tab by mouth daily.  Qty: 90 Tab, Refills: 1    Associated Diagnoses: Essential hypertension             Activity: Activity as tolerated  Diet: Cardiac Diet  Wound Care: None needed    Follow-up  ?? Follow up with PCP in 1 week.    Time spent to discharge patient 35 minutes  Signed:  Daneil DanLakshya Nazar Kuan, MD  09/09/2017  7:55 AM

## 2017-09-09 NOTE — Progress Notes (Signed)
Pt is stable, resting quietly in bed. Respirations even and unlabored, does not appear to be in distress. Denies any needs at this time. Safety measures in place, bed low and locked, clal light in reach. SBAR report given to oncoming nurse.

## 2017-09-09 NOTE — Progress Notes (Signed)
Pt resting in bed with eyes closed at this time. Pt alert and oriented to person place and time. No distress noted at this time. Respirations even and unlabored on room air. 22 right hand IV clean dry and intact. No signs of infection noted at this time. Pt denies pain at this time. Pt instructed to call for assistance if needed. Call light in place. Door open. Will continue to monitor.

## 2017-09-09 NOTE — Progress Notes (Signed)
Pt is stable, resting quietly in bed. Respirations even and unlabored, does not appear to be in distress. Denies any needs at this time. Safety measures in place, bed low and locked, clal light in reach. SBAR report given to oncoming nurse.

## 2017-09-09 NOTE — Progress Notes (Signed)
Reviewed pt lab work this am. Blood sugar 68. Rechecked blood sugar and is now 81. Gave pt 4 oz of orange juice. Will continue to monitor.

## 2017-09-09 NOTE — Progress Notes (Signed)
PRN hydralazine given for BP 176/96 P 75

## 2017-09-09 NOTE — Progress Notes (Signed)
Patient is discharging home today. She is independent in all ADLs. No discharge planning needs identified at this time. Case Management will remain available to assist as needed.    Care Management Interventions  PCP Verified by CM: Yes([place])  Transition of Care Consult (CM Consult): Discharge Planning  Discharge Durable Medical Equipment: No  Physical Therapy Consult: Yes  Occupational Therapy Consult: Yes  Speech Therapy Consult: No  Current Support Network: Own Home  Confirm Follow Up Transport: Self  Plan discussed with Pt/Family/Caregiver: Yes  Freedom of Choice Offered: Yes  Discharge Location  Discharge Placement: Home

## 2017-09-09 NOTE — Progress Notes (Signed)
BP 154/94 after administration of hydralazine.

## 2017-09-09 NOTE — Progress Notes (Signed)
BP 153/73. Pt is stable resting quietly in bed.

## 2017-09-09 NOTE — Progress Notes (Signed)
Reviewed pt lab work this am. Blood sugar 68. Rechecked blood sugar and is now 81. Gave pt 4 oz of orange juice. Will continue to monitor.

## 2017-09-09 NOTE — Progress Notes (Signed)
BP 154/94 after administration of hydralazine.

## 2017-09-15 ENCOUNTER — Ambulatory Visit: Attending: Family Medicine | Primary: Family Medicine

## 2017-09-15 ENCOUNTER — Encounter: Payer: PRIVATE HEALTH INSURANCE | Attending: Family Medicine | Primary: Family Medicine

## 2017-09-15 ENCOUNTER — Ambulatory Visit
Admit: 2017-09-15 | Discharge: 2017-09-15 | Payer: PRIVATE HEALTH INSURANCE | Attending: Family Medicine | Primary: Family Medicine

## 2017-09-15 DIAGNOSIS — I10 Essential (primary) hypertension: Secondary | ICD-10-CM

## 2017-09-15 NOTE — Progress Notes (Signed)
[facility]  _______________________________________  Christiana Pellant, MD                 [name], [place] [address]                                                                                    Phone: [phone]                                                                                    Fax: [phone]    Laurie Richard is a 72 y.o. female who is seen for evaluation of   Chief Complaint   Patient presents with   ??? Hospital Follow Up         HPI:     Accelerated HTN: I sent her to the ER last week because of very high BPDs 240s/130s with what seemed to be mental slowing. She admitted to me and the hospital docs that she is noncompliant with her medications. She ended up on IV labetalol, hydralazine, lisinopril, and clonidine. Per the DC summary he BP was "nominal" on PO lisinopril 20, norvasc 5, and hydralazine 25 TID. She states the hydralazine is making her feel bad and she states in the past that this medication caused her to rebound in the past.     BP Readings from Last 10 Encounters:   09/15/17 (!) 179/97   09/09/17 131/79   09/07/17 (!) 210/131   05/27/17 140/90   05/17/17 (!) 164/102     Lab Results   Component Value Date/Time    Sodium 138 09/09/2017 10:04 AM    Potassium 3.4 (L) 09/09/2017 10:04 AM    Chloride 106 09/09/2017 10:04 AM    CO2 24 09/09/2017 10:04 AM    Anion gap 8 09/09/2017 10:04 AM    Glucose 68 09/09/2017 10:04 AM    BUN 20 09/09/2017 10:04 AM    Creatinine 1.24 (H) 09/09/2017 10:04 AM    BUN/Creatinine ratio 15 05/17/2017 03:29 PM    GFR est AA 55 (L) 09/09/2017 10:04 AM    GFR est non-AA 45 (L) 09/09/2017 10:04 AM    Calcium 8.9 09/09/2017 10:04 AM    Bilirubin, total 0.9 05/17/2017 03:29 PM    AST (SGOT) 26 05/17/2017 03:29 PM    Alk. phosphatase 90 05/17/2017 03:29 PM    Protein, total 7.1 05/17/2017 03:29 PM    Albumin 4.3 05/17/2017 03:29 PM    A-G Ratio 1.5 05/17/2017 03:29 PM     ALT (SGPT) 18 05/17/2017 03:29 PM     Lab Results   Component Value Date/Time    WBC  4.6 09/07/2017 01:07 PM    HGB 13.6 09/07/2017 01:07 PM    HCT 42.9 09/07/2017 01:07 PM    PLATELET 171 09/07/2017 01:07 PM    MCV 75.9 (L) 09/07/2017 01:07 PM       Not surprisingly, she has kidney damage, likely from the high pressures. She was stage 3 as of hospital DC.     RIGHT KIDNEY:   Maximal length = 9.0 cm.  Cortical thickness = 12 mm.  The cortical-medullary  interface is maintained.  No hydronephrosis.  ??  Resistive Index = 0.63, 0.50, and 0.58.  (Normal Range = 0.5 - 0.7)  Acceleration Time = 54 ms and 34 ms.  (Normal Range < 100 ms)  Acceleration Index = 819 and 2082 cm/sec-sec.  (Normal Range > 300 cm/s)  ??  Renal Artery Velocity = 126, 142, and 175 cm/sec.    Ratio = 1.9.  (Normal Range < 3.5)  Renal vein is patent.  ??  LEFT KIDNEY:   Maximal length = 9.1 cm.  Cortical thickness = 12 mm.  The cortical-medullary  interface is maintained.  No hydronephrosis.  ??  Resistive Index = 0.54, 0.59, and 0.53.  (Normal Range = 0.5 - 0.7)  Acceleration Time = 34 ms and 27 ms.  (Normal Range < 100 ms)  Acceleration Index = _0 and 1059 cm/sec-sec.  (Normal Range > 300 cm/s)  ??  Renal Artery Velocity = 80, 84, and 94 cm/sec.    Ratio = 1.0.  (Normal Range < 3.5)  Renal vein is patent.  ??  ABDOMEN:   Aortic velocity = 91 cm/sec.   Inferior Vena Cava is patent.  ??  IMPRESSION  IMPRESSION:    Right renal artery: No evidence of hemodynamically significant stenosis.  ??  Left renal artery: No evidence of hemodynamically significant stenosis.    EXAM/TECHNIQUE: Carotid and Vertebral Ultrasound Examination. Grayscale, Color  Doppler, and Spectral Doppler Ultrasound interrogation performed.  Peak Systolic  Velocity, ICA-to-CCA ratio, and End-Diastolic Velocity are recorded.  The  estimate of stenosis is inferred from velocity measurements cross referenced to  published correlations as defined by the Society of Radiologists in  [facility] 2003; 229; 340-346.    ??  COMPARISON: None..  ??  FINDINGS:  Peak systolic Velocities (in cm/s):  Right Common Carotid Artery: 78  Right Internal Carotid Artery: 101   Ratio = 1.3.    ??  Left Common Carotid Artery: 72  Left Internal Carotid Artery: 103   Ratio = 1.4.    ??  There are overall triphasic to biphasic waveforms in the right common carotid  and internal carotid arteries.  There is mild atherosclerotic disease of the right common carotid artery, right  carotid bulb, and right ICA origin.Marland Kitchen   Antegrade flow in the right vertebral artery.  ??  There are overall triphasic to biphasic waveforms in the left common carotid and  internal carotid arteries.  There is mild atherosclerotic disease of the left common carotid artery, left  carotid bulb, and left ICA origin.  Antegrade flow in the left vertebral artery.  ??  IMPRESSION  IMPRESSION:    RICA:  No evidence of hemodynamically significant stenosis.   ??  LICA:  No evidence of hemodynamically significant stenosis. .    CT head without contrast  ??  History: hypertension and ? Abnormal CNS finding by PCP. Episode of bladder  incontinence.   ??  Technique: 65m axial images were  obtained from the skull base to the vertex  without intravenous contrast.  Radiation dose reduction techniques were used for  this study:  Our CT scanners use one or all of the following: Automated exposure  control, adjustment of the mA and/or kVp according to patient's size, iterative  reconstruction.  ??  Comparison: None  ??  Findings: The ventricles and sulci are normal in size and configuration. There  are no extra-axial fluid collections. There is no evidence to suggest an acute  major territorial infarct. There is no evidence of acute intraparenchymal  hemorrhage or mass effect. Patchy areas of decreased attenuation within the  supratentorial periventricular white matter would be most compatible with mild   chronic small vessel ischemic change. The bony calvarium is intact. The  visualized mastoid air cells and paranasal sinuses are well pneumatized and  aerated.  ??  IMPRESSION    Review of Systems:  Review of Systems   Constitutional: Positive for malaise/fatigue. Negative for chills and fever.   Respiratory: Negative for cough and shortness of breath.    Cardiovascular: Negative for chest pain and palpitations.   Gastrointestinal: Negative for abdominal pain, blood in stool, melena, nausea and vomiting.   Genitourinary: Negative for hematuria.       History:  Past Medical History:   Diagnosis Date   ??? Hypertension 50'S ?    PATIENT HAS BEEN NONCOMPLAINT WITH ANTIHYPERTENSIVE MEDS SINCE AT LEAST 2015   ??? Noncompliance        Past Surgical History:   Procedure Laterality Date   ??? HX ANKLE FRACTURE TX  2019    LEFT ANKLE FRACTURE WITH ORIF   ??? HX BILATERAL SALPINGO-OOPHORECTOMY     ??? HX HYSTERECTOMY     ??? HX TONSILLECTOMY         Family History   Problem Relation Age of Onset   ??? Hypertension Mother         AGE 75   ??? Heart Disease Mother    ??? Cancer Father         PROSTATE.  DIED AT 35   ??? Hypertension Sister         AND DIABETES   ??? Hypertension Brother    ??? Other Brother         DIED AT 71. MVA   ??? Drug Abuse Brother         DIED AT 4       Social History     Tobacco Use   ??? Smoking status: Never Smoker   ??? Smokeless tobacco: Never Used   Substance Use Topics   ??? Alcohol use: Never     Frequency: Never       Allergies   Allergen Reactions   ??? Opioids - Morphine Analogues Unknown (comments)     Had major altered state, felt like she was going to have a stroke   ??? Peanut Other (comments)     HEADACHE       Vitals:  Visit Vitals  BP (!) 179/97   Pulse 76   Ht 5' (1.524 m)   Wt 154 lb (69.9 kg)   BMI 30.08 kg/m??       Physical Exam:  Physical Exam   Constitutional: She is oriented to person, place, and time. She appears well-developed and well-nourished. No distress.   HENT:   Head: Normocephalic and atraumatic.    Nose: Nose normal.   Mouth/Throat: Oropharynx is clear and moist.   Eyes: Pupils are  equal, round, and reactive to light. Conjunctivae and EOM are normal.   Neck: Normal range of motion. Neck supple.   Cardiovascular: Normal rate, regular rhythm and normal heart sounds. Exam reveals no gallop and no friction rub.   No murmur heard.  Pulmonary/Chest: Effort normal and breath sounds normal. No respiratory distress. She has no wheezes. She has no rales.   Abdominal: Soft. Bowel sounds are normal. She exhibits no distension and no mass. There is no tenderness. There is no rebound and no guarding.   Musculoskeletal: Normal range of motion. She exhibits no edema or tenderness.   Neurological: She is alert and oriented to person, place, and time. She has normal reflexes. No cranial nerve deficit. Coordination normal.   Skin: Skin is warm and dry. No rash noted. She is not diaphoretic. No erythema.   Psychiatric: She has a normal mood and affect. Her behavior is normal.   Nursing note and vitals reviewed.      Assessment and Plan:   1. Essential hypertension  Stop hydralazine as she has not been able to tolerate it long term in the past, states previous try at this results in her doctor telling her not to take this medicine. CKD is mild in degree with borderline low K, will increase lisinopril to 82m and then have her back in a week or 2 to recheck BP and renal function.   - lisinopril (PRINIVIL, ZESTRIL) 20 mg tablet; Take 2 Tabs by mouth daily.  Dispense: 90 Tab; Refill: 1    2. Medically noncompliant  It is still a struggle to get her to agree to take her medications as she should, but she is more willing at this point, will continue to motivate her.     3. Chronic kidney disease, stage 3 (HCC)  Stable, will trend renal function after med changes as above.     FU 1w    JLaurel Dimmer MD

## 2017-09-15 NOTE — Patient Instructions (Signed)
DASH Diet: Care Instructions  Your Care Instructions    The DASH diet is an eating plan that can help lower your blood pressure. DASH stands for Dietary Approaches to Stop Hypertension. Hypertension is high blood pressure.  The DASH diet focuses on eating foods that are high in calcium, potassium, and magnesium. These nutrients can lower blood pressure. The foods that are highest in these nutrients are fruits, vegetables, low-fat dairy products, nuts, seeds, and legumes. But taking calcium, potassium, and magnesium supplements instead of eating foods that are high in those nutrients does not have the same effect. The DASH diet also includes whole grains, fish, and poultry.  The DASH diet is one of several lifestyle changes your doctor may recommend to lower your high blood pressure. Your doctor may also want you to decrease the amount of sodium in your diet. Lowering sodium while following the DASH diet can lower blood pressure even further than just the DASH diet alone.  Follow-up care is a key part of your treatment and safety. Be sure to make and go to all appointments, and call your doctor if you are having problems. It's also a good idea to know your test results and keep a list of the medicines you take.  How can you care for yourself at home?  Following the DASH diet  ?? Eat 4 to 5 servings of fruit each day. A serving is 1 medium-sized piece of fruit, ?? cup chopped or canned fruit, 1/4 cup dried fruit, or 4 ounces (?? cup) of fruit juice. Choose fruit more often than fruit juice.  ?? Eat 4 to 5 servings of vegetables each day. A serving is 1 cup of lettuce or raw leafy vegetables, ?? cup of chopped or cooked vegetables, or 4 ounces (?? cup) of vegetable juice. Choose vegetables more often than vegetable juice.  ?? Get 2 to 3 servings of low-fat and fat-free dairy each day. A serving is 8 ounces of milk, 1 cup of yogurt, or 1 ?? ounces of cheese.   ?? Eat 6 to 8 servings of grains each day. A serving is 1 slice of bread, 1 ounce of dry cereal, or ?? cup of cooked rice, pasta, or cooked cereal. Try to choose whole-grain products as much as possible.  ?? Limit lean meat, poultry, and fish to 2 servings each day. A serving is 3 ounces, about the size of a deck of cards.  ?? Eat 4 to 5 servings of nuts, seeds, and legumes (cooked dried beans, lentils, and split peas) each week. A serving is 1/3 cup of nuts, 2 tablespoons of seeds, or ?? cup of cooked beans or peas.  ?? Limit fats and oils to 2 to 3 servings each day. A serving is 1 teaspoon of vegetable oil or 2 tablespoons of salad dressing.  ?? Limit sweets and added sugars to 5 servings or less a week. A serving is 1 tablespoon jelly or jam, ?? cup sorbet, or 1 cup of lemonade.  ?? Eat less than 2,300 milligrams (mg) of sodium a day. If you limit your sodium to 1,500 mg a day, you can lower your blood pressure even more.  Tips for success  ?? Start small. Do not try to make dramatic changes to your diet all at once. You might feel that you are missing out on your favorite foods and then be more likely to not follow the plan. Make small changes, and stick with them. Once those changes become habit, add a   few more changes.  ?? Try some of the following:  ? Make it a goal to eat a fruit or vegetable at every meal and at snacks. This will make it easy to get the recommended amount of fruits and vegetables each day.  ? Try yogurt topped with fruit and nuts for a snack or healthy dessert.  ? Add lettuce, tomato, cucumber, and onion to sandwiches.  ? Combine a ready-made pizza crust with low-fat mozzarella cheese and lots of vegetable toppings. Try using tomatoes, squash, spinach, broccoli, carrots, cauliflower, and onions.  ? Have a variety of cut-up vegetables with a low-fat dip as an appetizer instead of chips and dip.  ? Sprinkle sunflower seeds or chopped almonds over salads. Or try adding  chopped walnuts or almonds to cooked vegetables.  ? Try some vegetarian meals using beans and peas. Add garbanzo or kidney beans to salads. Make burritos and tacos with mashed pinto beans or black beans.  Where can you learn more?  Go to http://www.healthwise.net/[employer].  Enter H967 in the search box to learn more about "DASH Diet: Care Instructions."  Current as of: August 01, 2016  Content Version: 12.1  ?? 2006-2019 [employer]. Care instructions adapted under license by [employer] (which disclaims liability or warranty for this information). If you have questions about a medical condition or this instruction, always ask your healthcare professional. [employer] disclaims any warranty or liability for your use of this information.

## 2017-09-15 NOTE — Progress Notes (Signed)
[facility]  _______________________________________  Christiana Pellant, MD                 [name], [place] [address]                                                                                    Phone: [phone]                                                                                    Fax: [phone]    Laurie Richard is a 72 y.o. female who is seen for evaluation of   Chief Complaint   Patient presents with   ??? Hospital Follow Up         HPI:     Accelerated HTN: I sent her to the ER last week because of very high BPDs 240s/130s with what seemed to be mental slowing. She admitted to me and the hospital docs that she is noncompliant with her medications. She ended up on IV labetalol, hydralazine, lisinopril, and clonidine. Per the DC summary he BP was "nominal" on PO lisinopril 20, norvasc 5, and hydralazine 25 TID. She states the hydralazine is making her feel bad and she states in the past that this medication caused her to rebound in the past.     BP Readings from Last 10 Encounters:   09/15/17 (!) 179/97   09/09/17 131/79   09/07/17 (!) 210/131   05/27/17 140/90   05/17/17 (!) 164/102     Lab Results   Component Value Date/Time    Sodium 138 09/09/2017 10:04 AM    Potassium 3.4 (L) 09/09/2017 10:04 AM    Chloride 106 09/09/2017 10:04 AM    CO2 24 09/09/2017 10:04 AM    Anion gap 8 09/09/2017 10:04 AM    Glucose 68 09/09/2017 10:04 AM    BUN 20 09/09/2017 10:04 AM    Creatinine 1.24 (H) 09/09/2017 10:04 AM    BUN/Creatinine ratio 15 05/17/2017 03:29 PM    GFR est AA 55 (L) 09/09/2017 10:04 AM    GFR est non-AA 45 (L) 09/09/2017 10:04 AM    Calcium 8.9 09/09/2017 10:04 AM    Bilirubin, total 0.9 05/17/2017 03:29 PM    AST (SGOT) 26 05/17/2017 03:29 PM    Alk. phosphatase 90 05/17/2017 03:29 PM    Protein, total 7.1 05/17/2017 03:29 PM    Albumin 4.3 05/17/2017 03:29 PM    A-G Ratio 1.5 05/17/2017 03:29 PM     ALT (SGPT) 18 05/17/2017 03:29 PM     Lab Results   Component Value Date/Time    WBC  4.6 09/07/2017 01:07 PM    HGB 13.6 09/07/2017 01:07 PM    HCT 42.9 09/07/2017 01:07 PM    PLATELET 171 09/07/2017 01:07 PM    MCV 75.9 (L) 09/07/2017 01:07 PM       Not surprisingly, she has kidney damage, likely from the high pressures. She was stage 3 as of hospital DC.     RIGHT KIDNEY:   Maximal length = 9.0 cm.  Cortical thickness = 12 mm.  The cortical-medullary  interface is maintained.  No hydronephrosis.  ??  Resistive Index = 0.63, 0.50, and 0.58.  (Normal Range = 0.5 - 0.7)  Acceleration Time = 54 ms and 34 ms.  (Normal Range < 100 ms)  Acceleration Index = 819 and 2082 cm/sec-sec.  (Normal Range > 300 cm/s)  ??  Renal Artery Velocity = 126, 142, and 175 cm/sec.    Ratio = 1.9.  (Normal Range < 3.5)  Renal vein is patent.  ??  LEFT KIDNEY:   Maximal length = 9.1 cm.  Cortical thickness = 12 mm.  The cortical-medullary  interface is maintained.  No hydronephrosis.  ??  Resistive Index = 0.54, 0.59, and 0.53.  (Normal Range = 0.5 - 0.7)  Acceleration Time = 34 ms and 27 ms.  (Normal Range < 100 ms)  Acceleration Index = _0 and 1059 cm/sec-sec.  (Normal Range > 300 cm/s)  ??  Renal Artery Velocity = 80, 84, and 94 cm/sec.    Ratio = 1.0.  (Normal Range < 3.5)  Renal vein is patent.  ??  ABDOMEN:   Aortic velocity = 91 cm/sec.   Inferior Vena Cava is patent.  ??  IMPRESSION  IMPRESSION:    Right renal artery: No evidence of hemodynamically significant stenosis.  ??  Left renal artery: No evidence of hemodynamically significant stenosis.    EXAM/TECHNIQUE: Carotid and Vertebral Ultrasound Examination. Grayscale, Color  Doppler, and Spectral Doppler Ultrasound interrogation performed.  Peak Systolic  Velocity, ICA-to-CCA ratio, and End-Diastolic Velocity are recorded.  The  estimate of stenosis is inferred from velocity measurements cross referenced to  published correlations as defined by the Society of Radiologists in  [facility] 2003; 229; 340-346.    ??  COMPARISON: None..  ??  FINDINGS:  Peak systolic Velocities (in cm/s):  Right Common Carotid Artery: 78  Right Internal Carotid Artery: 101   Ratio = 1.3.    ??  Left Common Carotid Artery: 72  Left Internal Carotid Artery: 103   Ratio = 1.4.    ??  There are overall triphasic to biphasic waveforms in the right common carotid  and internal carotid arteries.  There is mild atherosclerotic disease of the right common carotid artery, right  carotid bulb, and right ICA origin.Marland Kitchen   Antegrade flow in the right vertebral artery.  ??  There are overall triphasic to biphasic waveforms in the left common carotid and  internal carotid arteries.  There is mild atherosclerotic disease of the left common carotid artery, left  carotid bulb, and left ICA origin.  Antegrade flow in the left vertebral artery.  ??  IMPRESSION  IMPRESSION:    RICA:  No evidence of hemodynamically significant stenosis.   ??  LICA:  No evidence of hemodynamically significant stenosis. .    CT head without contrast  ??  History: hypertension and ? Abnormal CNS finding by PCP. Episode of bladder  incontinence.   ??  Technique: 65m axial images were  obtained from the skull base to the vertex  without intravenous contrast.  Radiation dose reduction techniques were used for  this study:  Our CT scanners use one or all of the following: Automated exposure  control, adjustment of the mA and/or kVp according to patient's size, iterative  reconstruction.  ??  Comparison: None  ??  Findings: The ventricles and sulci are normal in size and configuration. There  are no extra-axial fluid collections. There is no evidence to suggest an acute  major territorial infarct. There is no evidence of acute intraparenchymal  hemorrhage or mass effect. Patchy areas of decreased attenuation within the  supratentorial periventricular white matter would be most compatible with mild  chronic small vessel ischemic change. The  bony calvarium is intact. The  visualized mastoid air cells and paranasal sinuses are well pneumatized and  aerated.  ??  IMPRESSION    Review of Systems:  Review of Systems   Constitutional: Positive for malaise/fatigue. Negative for chills and fever.   Respiratory: Negative for cough and shortness of breath.    Cardiovascular: Negative for chest pain and palpitations.   Gastrointestinal: Negative for abdominal pain, blood in stool, melena, nausea and vomiting.   Genitourinary: Negative for hematuria.       History:  Past Medical History:   Diagnosis Date   ??? Hypertension 50'S ?    PATIENT HAS BEEN NONCOMPLAINT WITH ANTIHYPERTENSIVE MEDS SINCE AT LEAST 2015   ??? Noncompliance        Past Surgical History:   Procedure Laterality Date   ??? HX ANKLE FRACTURE TX  2019    LEFT ANKLE FRACTURE WITH ORIF   ??? HX BILATERAL SALPINGO-OOPHORECTOMY     ??? HX HYSTERECTOMY     ??? HX TONSILLECTOMY         Family History   Problem Relation Age of Onset   ??? Hypertension Mother         AGE 51   ??? Heart Disease Mother    ??? Cancer Father         PROSTATE.  DIED AT 46   ??? Hypertension Sister         AND DIABETES   ??? Hypertension Brother    ??? Other Brother         DIED AT 31. MVA   ??? Drug Abuse Brother         DIED AT 50       Social History     Tobacco Use   ??? Smoking status: Never Smoker   ??? Smokeless tobacco: Never Used   Substance Use Topics   ??? Alcohol use: Never     Frequency: Never       Allergies   Allergen Reactions   ??? Opioids - Morphine Analogues Unknown (comments)     Had major altered state, felt like she was going to have a stroke   ??? Peanut Other (comments)     HEADACHE       Vitals:  Visit Vitals  BP (!) 179/97   Pulse 76   Ht 5' (1.524 m)   Wt 154 lb (69.9 kg)   BMI 30.08 kg/m??       Physical Exam:  Physical Exam   Constitutional: She is oriented to person, place, and time. She appears well-developed and well-nourished. No distress.   HENT:   Head: Normocephalic and atraumatic.   Nose: Nose normal.   Mouth/Throat: Oropharynx  is clear and moist.   Eyes: Pupils are  equal, round, and reactive to light. Conjunctivae and EOM are normal.   Neck: Normal range of motion. Neck supple.   Cardiovascular: Normal rate, regular rhythm and normal heart sounds. Exam reveals no gallop and no friction rub.   No murmur heard.  Pulmonary/Chest: Effort normal and breath sounds normal. No respiratory distress. She has no wheezes. She has no rales.   Abdominal: Soft. Bowel sounds are normal. She exhibits no distension and no mass. There is no tenderness. There is no rebound and no guarding.   Musculoskeletal: Normal range of motion. She exhibits no edema or tenderness.   Neurological: She is alert and oriented to person, place, and time. She has normal reflexes. No cranial nerve deficit. Coordination normal.   Skin: Skin is warm and dry. No rash noted. She is not diaphoretic. No erythema.   Psychiatric: She has a normal mood and affect. Her behavior is normal.   Nursing note and vitals reviewed.      Assessment and Plan:   1. Essential hypertension  Stop hydralazine as she has not been able to tolerate it long term in the past, states previous try at this results in her doctor telling her not to take this medicine. CKD is mild in degree with borderline low K, will increase lisinopril to 82m and then have her back in a week or 2 to recheck BP and renal function.   - lisinopril (PRINIVIL, ZESTRIL) 20 mg tablet; Take 2 Tabs by mouth daily.  Dispense: 90 Tab; Refill: 1    2. Medically noncompliant  It is still a struggle to get her to agree to take her medications as she should, but she is more willing at this point, will continue to motivate her.     3. Chronic kidney disease, stage 3 (HCC)  Stable, will trend renal function after med changes as above.     FU 1w    JLaurel Dimmer MD

## 2017-09-22 ENCOUNTER — Ambulatory Visit: Attending: Family Medicine | Primary: Family Medicine

## 2017-09-22 ENCOUNTER — Ambulatory Visit
Admit: 2017-09-22 | Discharge: 2017-09-22 | Payer: PRIVATE HEALTH INSURANCE | Attending: Family Medicine | Primary: Family Medicine

## 2017-09-22 DIAGNOSIS — I10 Essential (primary) hypertension: Secondary | ICD-10-CM

## 2017-09-22 MED ORDER — LISINOPRIL 20 MG TAB
20 mg | ORAL_TABLET | Freq: Every day | ORAL | 1 refills | Status: DC
Start: 2017-09-22 — End: 2017-09-22

## 2017-09-22 MED ORDER — LISINOPRIL 40 MG TAB
40 mg | ORAL_TABLET | Freq: Every day | ORAL | 1 refills | Status: DC
Start: 2017-09-22 — End: 2018-01-01

## 2017-09-22 MED ORDER — CHLORTHALIDONE 25 MG TAB
25 mg | ORAL_TABLET | Freq: Every day | ORAL | 1 refills | Status: DC
Start: 2017-09-22 — End: 2019-02-06

## 2017-09-22 NOTE — Progress Notes (Signed)
Please make sure she knows they need to redraw her labs, there wasn't enough blood in the tube to run her tests. They are supposed to be contacting her to set this up.

## 2017-09-22 NOTE — Progress Notes (Signed)
[facility]  _______________________________________  Christiana Pellant, MD                 [name]                     [place] [address]                                                                                    Phone: [phone]                                                                                    Fax: [phone]    Laurie Richard is a 72 y.o. female who is seen for evaluation of   Chief Complaint   Patient presents with   ??? Hypertension     follow up          HPI:     Accelerated HTN: I sent her to the ER 2 weeks ago because of very high BPDs 240s/130s with what seemed to be mental slowing. She admitted to me and the hospital docs that she is noncompliant with her medications. She ended up on IV labetalol, hydralazine, lisinopril, and clonidine. Per the DC summary he BP was "nominal" on PO lisinopril 20, norvasc 5, and hydralazine 25 TID. She states the hydralazine is making her feel bad and she states in the past that this medication caused her to rebound in the past. When she saw me last week we stopped the hydralazine nd increased  Lisinopril to 53m. This has resulted in some slight improvement of her DBP.     BP Readings from Last 10 Encounters:   09/22/17 (!) 162/97   09/15/17 (!) 179/97   09/09/17 131/79   09/07/17 (!) 210/131   05/27/17 140/90   05/17/17 (!) 164/102     Lab Results   Component Value Date/Time    Sodium 138 09/09/2017 10:04 AM    Potassium 3.4 (L) 09/09/2017 10:04 AM    Chloride 106 09/09/2017 10:04 AM    CO2 24 09/09/2017 10:04 AM    Anion gap 8 09/09/2017 10:04 AM    Glucose 68 09/09/2017 10:04 AM    BUN 20 09/09/2017 10:04 AM    Creatinine 1.24 (H) 09/09/2017 10:04 AM    BUN/Creatinine ratio 15 05/17/2017 03:29 PM    GFR est AA 55 (L) 09/09/2017 10:04 AM    GFR est non-AA 45 (L) 09/09/2017 10:04 AM    Calcium 8.9 09/09/2017 10:04 AM    Bilirubin, total 0.9 05/17/2017 03:29 PM     AST (SGOT) 26 05/17/2017 03:29 PM    Alk. phosphatase 90 05/17/2017 03:29 PM    Protein, total 7.1 05/17/2017 03:29 PM    Albumin  4.3 05/17/2017 03:29 PM    A-G Ratio 1.5 05/17/2017 03:29 PM    ALT (SGPT) 18 05/17/2017 03:29 PM     Lab Results   Component Value Date/Time    WBC 4.6 09/07/2017 01:07 PM    HGB 13.6 09/07/2017 01:07 PM    HCT 42.9 09/07/2017 01:07 PM    PLATELET 171 09/07/2017 01:07 PM    MCV 75.9 (L) 09/07/2017 01:07 PM       Not surprisingly, she has kidney damage, likely from the high pressures. She was stage 3 as of hospital DC.     RIGHT KIDNEY:   Maximal length = 9.0 cm.  Cortical thickness = 12 mm.  The cortical-medullary  interface is maintained.  No hydronephrosis.  ??  Resistive Index = 0.63, 0.50, and 0.58.  (Normal Range = 0.5 - 0.7)  Acceleration Time = 54 ms and 34 ms.  (Normal Range < 100 ms)  Acceleration Index = 819 and 2082 cm/sec-sec.  (Normal Range > 300 cm/s)  ??  Renal Artery Velocity = 126, 142, and 175 cm/sec.    Ratio = 1.9.  (Normal Range < 3.5)  Renal vein is patent.  ??  LEFT KIDNEY:   Maximal length = 9.1 cm.  Cortical thickness = 12 mm.  The cortical-medullary  interface is maintained.  No hydronephrosis.  ??  Resistive Index = 0.54, 0.59, and 0.53.  (Normal Range = 0.5 - 0.7)  Acceleration Time = 34 ms and 27 ms.  (Normal Range < 100 ms)  Acceleration Index = 2 1 4 7  and 1059 cm/sec-sec.  (Normal Range > 300 cm/s)  ??  Renal Artery Velocity = 80, 84, and 94 cm/sec.    Ratio = 1.0.  (Normal Range < 3.5)  Renal vein is patent.  ??  ABDOMEN:   Aortic velocity = 91 cm/sec.   Inferior Vena Cava is patent.  ??  IMPRESSION  IMPRESSION:    Right renal artery: No evidence of hemodynamically significant stenosis.  ??  Left renal artery: No evidence of hemodynamically significant stenosis.    EXAM/TECHNIQUE: Carotid and Vertebral Ultrasound Examination. Grayscale, Color  Doppler, and Spectral Doppler Ultrasound interrogation performed.  Peak Systolic   Velocity, ICA-to-CCA ratio, and End-Diastolic Velocity are recorded.  The  estimate of stenosis is inferred from velocity measurements cross referenced to  published correlations as defined by the Society of Radiologists in [facility] 2003; 229; 340-346.    ??  COMPARISON: None..  ??  FINDINGS:  Peak systolic Velocities (in cm/s):  Right Common Carotid Artery: 78  Right Internal Carotid Artery: 101   Ratio = 1.3.    ??  Left Common Carotid Artery: 72  Left Internal Carotid Artery: 103   Ratio = 1.4.    ??  There are overall triphasic to biphasic waveforms in the right common carotid  and internal carotid arteries.  There is mild atherosclerotic disease of the right common carotid artery, right  carotid bulb, and right ICA origin.Marland Kitchen   Antegrade flow in the right vertebral artery.  ??  There are overall triphasic to biphasic waveforms in the left common carotid and  internal carotid arteries.  There is mild atherosclerotic disease of the left common carotid artery, left  carotid bulb, and left ICA origin.  Antegrade flow in the left vertebral artery.  ??  IMPRESSION  IMPRESSION:    RICA:  No evidence of hemodynamically significant stenosis.   ??  LICA:  No evidence of  hemodynamically significant stenosis. .    CT head without contrast  ??  History: hypertension and ? Abnormal CNS finding by PCP. Episode of bladder  incontinence.   ??  Technique: 63m axial images were obtained from the skull base to the vertex  without intravenous contrast.  Radiation dose reduction techniques were used for  this study:  Our CT scanners use one or all of the following: Automated exposure  control, adjustment of the mA and/or kVp according to patient's size, iterative  reconstruction.  ??  Comparison: None  ??  Findings: The ventricles and sulci are normal in size and configuration. There  are no extra-axial fluid collections. There is no evidence to suggest an acute   major territorial infarct. There is no evidence of acute intraparenchymal  hemorrhage or mass effect. Patchy areas of decreased attenuation within the  supratentorial periventricular white matter would be most compatible with mild  chronic small vessel ischemic change. The bony calvarium is intact. The  visualized mastoid air cells and paranasal sinuses are well pneumatized and  aerated.  ??  IMPRESSION    Review of Systems:  Review of Systems   Constitutional: Positive for malaise/fatigue. Negative for chills and fever.   Respiratory: Negative for cough and shortness of breath.    Cardiovascular: Negative for chest pain and palpitations.   Gastrointestinal: Negative for abdominal pain, blood in stool, melena, nausea and vomiting.   Genitourinary: Negative for hematuria.       History:  Past Medical History:   Diagnosis Date   ??? Hypertension 50'S ?    PATIENT HAS BEEN NONCOMPLAINT WITH ANTIHYPERTENSIVE MEDS SINCE AT LEAST 2015   ??? Noncompliance        Past Surgical History:   Procedure Laterality Date   ??? HX ANKLE FRACTURE TX  2019    LEFT ANKLE FRACTURE WITH ORIF   ??? HX BILATERAL SALPINGO-OOPHORECTOMY     ??? HX HYSTERECTOMY     ??? HX TONSILLECTOMY         Family History   Problem Relation Age of Onset   ??? Hypertension Mother         AGE 10866  ??? Heart Disease Mother    ??? Cancer Father         PROSTATE.  DIED AT 657  ??? Hypertension Sister         AND DIABETES   ??? Hypertension Brother    ??? Other Brother         DIED AT 154 MVA   ??? Drug Abuse Brother         DIED AT 664      Social History     Tobacco Use   ??? Smoking status: Never Smoker   ??? Smokeless tobacco: Never Used   Substance Use Topics   ??? Alcohol use: Never     Frequency: Never       Allergies   Allergen Reactions   ??? Opioids - Morphine Analogues Unknown (comments)     Had major altered state, felt like she was going to have a stroke   ??? Peanut Other (comments)     HEADACHE       Vitals:  Visit Vitals  BP (!) 162/97   Pulse 72   Ht 5' (1.524 m)    Wt 153 lb (69.4 kg)   BMI 29.88 kg/m??       Physical Exam:  Physical Exam   Constitutional: She is oriented to person,  place, and time. She appears well-developed and well-nourished. No distress.   HENT:   Head: Normocephalic and atraumatic.   Nose: Nose normal.   Mouth/Throat: Oropharynx is clear and moist.   Eyes: Pupils are equal, round, and reactive to light. Conjunctivae and EOM are normal.   Neck: Normal range of motion. Neck supple.   Cardiovascular: Normal rate, regular rhythm and normal heart sounds. Exam reveals no gallop and no friction rub.   No murmur heard.  Pulmonary/Chest: Effort normal and breath sounds normal. No respiratory distress. She has no wheezes. She has no rales.   Abdominal: Soft. Bowel sounds are normal. She exhibits no distension and no mass. There is no tenderness. There is no rebound and no guarding.   Musculoskeletal: Normal range of motion. She exhibits no edema or tenderness.   Neurological: She is alert and oriented to person, place, and time. She has normal reflexes. No cranial nerve deficit. Coordination normal.   Skin: Skin is warm and dry. No rash noted. She is not diaphoretic. No erythema.   Psychiatric: She has a normal mood and affect. Her behavior is normal.   Nursing note and vitals reviewed.      Assessment and Plan:   1. Essential hypertension  Improving, but DBP still high, will add chlorthalidone. Recheck on FU.   - chlorthalidone (HYGROTEN) 25 mg tablet; Take 1 Tab by mouth daily.  Dispense: 90 Tab; Refill: 1  - METABOLIC PANEL, BASIC  - lisinopril (PRINIVIL, ZESTRIL) 40 mg tablet; Take 1 Tab by mouth daily.  Dispense: 90 Tab; Refill: 1    2. Medically noncompliant  She started telling me how she read that amlodipine increased the risk of MI. I told her very directly that her pressures as they have been will lead to heart failure and likely hemorrhagic stroke along with possible kidney failure and that she should be more worried about those things. She  relented and agreed to take her medications.     3. Chronic kidney disease, stage 3 (HCC)  Will trend out renal function, have not rechecked since she was in the hospital.   - METABOLIC PANEL, BASIC        FU 4w    Laurel Dimmer, MD

## 2017-09-22 NOTE — Progress Notes (Signed)
Please make sure she knows they need to redraw her labs, there wasn't enough blood in the tube to run her tests. They are supposed to be contacting her to set this up.

## 2017-09-22 NOTE — Patient Instructions (Signed)
Heart-Healthy Diet: Care Instructions  Your Care Instructions    A heart-healthy diet has lots of vegetables, fruits, nuts, beans, and whole grains, and is low in salt. It limits foods that are high in saturated fat, such as meats, cheeses, and fried foods. It may be hard to change your diet, but even small changes can lower your risk of heart attack and heart disease.  Follow-up care is a key part of your treatment and safety. Be sure to make and go to all appointments, and call your doctor if you are having problems. It's also a good idea to know your test results and keep a list of the medicines you take.  How can you care for yourself at home?  Watch your portions  ?? Learn what a serving is. A "serving" and a "portion" are not always the same thing. Make sure that you are not eating larger portions than are recommended. For example, a serving of pasta is ?? cup. A serving size of meat is 2 to 3 ounces. A 3-ounce serving is about the size of a deck of cards. Measure serving sizes until you are good at "eyeballing" them. Keep in mind that restaurants often serve portions that are 2 or 3 times the size of one serving.  ?? To keep your energy level up and keep you from feeling hungry, eat often but in smaller portions.  ?? Eat only the number of calories you need to stay at a healthy weight. If you need to lose weight, eat fewer calories than your body burns (through exercise and other physical activity).  Eat more fruits and vegetables  ?? Eat a variety of fruit and vegetables every day. Dark green, deep orange, red, or yellow fruits and vegetables are especially good for you. Examples include spinach, carrots, peaches, and berries.  ?? Keep carrots, celery, and other veggies handy for snacks. Buy fruit that is in season and store it where you can see it so that you will be tempted to eat it.  ?? Cook dishes that have a lot of veggies in them, such as stir-fries and soups.  Limit saturated and trans fat   ?? Read food labels, and try to avoid saturated and trans fats. They increase your risk of heart disease. Trans fat is found in many processed foods such as cookies and crackers.  ?? Use olive or canola oil when you cook. Try cholesterol-lowering spreads, such as Benecol or Take Control.  ?? Bake, broil, grill, or steam foods instead of frying them.  ?? Choose lean meats instead of high-fat meats such as hot dogs and sausages. Cut off all visible fat when you prepare meat.  ?? Eat fish, skinless poultry, and meat alternatives such as soy products instead of high-fat meats. Soy products, such as tofu, may be especially good for your heart.  ?? Choose low-fat or fat-free milk and dairy products.  Eat fish  ?? Eat at least two servings of fish a week. Certain fish, such as salmon and tuna, contain omega-3 fatty acids, which may help reduce your risk of heart attack.  Eat foods high in fiber  ?? Eat a variety of grain products every day. Include whole-grain foods that have lots of fiber and nutrients. Examples of whole-grain foods include oats, whole wheat bread, and brown rice.  ?? Buy whole-grain breads and cereals, instead of white bread or pastries.  Limit salt and sodium  ?? Limit how much salt and sodium you eat to help   lower your blood pressure.  ?? Taste food before you salt it. Add only a little salt when you think you need it. With time, your taste buds will adjust to less salt.  ?? Eat fewer snack items, fast foods, and other high-salt, processed foods. Check food labels for the amount of sodium in packaged foods.  ?? Choose low-sodium versions of canned goods (such as soups, vegetables, and beans).  Limit sugar  ?? Limit drinks and foods with added sugar. These include candy, desserts, and soda pop.  Limit alcohol  ?? Limit alcohol to no more than 2 drinks a day for men and 1 drink a day for women. Too much alcohol can cause health problems.  When should you call for help?   Watch closely for changes in your health, and be sure to contact your doctor if:  ?? ?? You would like help planning heart-healthy meals.   Where can you learn more?  Go to http://www.healthwise.net/[employer].  Enter V137 in the search box to learn more about "Heart-Healthy Diet: Care Instructions."  Current as of: August 01, 2016  Content Version: 12.1  ?? 2006-2019 [employer]. Care instructions adapted under license by [employer] (which disclaims liability or warranty for this information). If you have questions about a medical condition or this instruction, always ask your healthcare professional. [employer] disclaims any warranty or liability for your use of this information.

## 2017-09-22 NOTE — Progress Notes (Signed)
[facility]  _______________________________________  Christiana Pellant, MD                 [name], [place] [address]                                                                                    Phone: [phone]                                                                                    Fax: [phone]    Laurie Richard is a 72 y.o. female who is seen for evaluation of   Chief Complaint   Patient presents with   ??? Hypertension     follow up          HPI:     Accelerated HTN: I sent her to the ER 2 weeks ago because of very high BPDs 240s/130s with what seemed to be mental slowing. She admitted to me and the hospital docs that she is noncompliant with her medications. She ended up on IV labetalol, hydralazine, lisinopril, and clonidine. Per the DC summary he BP was "nominal" on PO lisinopril 20, norvasc 5, and hydralazine 25 TID. She states the hydralazine is making her feel bad and she states in the past that this medication caused her to rebound in the past. When she saw me last week we stopped the hydralazine nd increased  Lisinopril to 51m. This has resulted in some slight improvement of her DBP.     BP Readings from Last 10 Encounters:   09/22/17 (!) 162/97   09/15/17 (!) 179/97   09/09/17 131/79   09/07/17 (!) 210/131   05/27/17 140/90   05/17/17 (!) 164/102     Lab Results   Component Value Date/Time    Sodium 138 09/09/2017 10:04 AM    Potassium 3.4 (L) 09/09/2017 10:04 AM    Chloride 106 09/09/2017 10:04 AM    CO2 24 09/09/2017 10:04 AM    Anion gap 8 09/09/2017 10:04 AM    Glucose 68 09/09/2017 10:04 AM    BUN 20 09/09/2017 10:04 AM    Creatinine 1.24 (H) 09/09/2017 10:04 AM    BUN/Creatinine ratio 15 05/17/2017 03:29 PM    GFR est AA 55 (L) 09/09/2017 10:04 AM    GFR est non-AA 45 (L) 09/09/2017 10:04 AM    Calcium 8.9 09/09/2017 10:04 AM    Bilirubin, total 0.9 05/17/2017 03:29 PM    AST (SGOT) 26  05/17/2017 03:29 PM    Alk. phosphatase 90 05/17/2017 03:29 PM    Protein, total 7.1 05/17/2017 03:29 PM    Albumin  4.3 05/17/2017 03:29 PM    A-G Ratio 1.5 05/17/2017 03:29 PM    ALT (SGPT) 18 05/17/2017 03:29 PM     Lab Results   Component Value Date/Time    WBC 4.6 09/07/2017 01:07 PM    HGB 13.6 09/07/2017 01:07 PM    HCT 42.9 09/07/2017 01:07 PM    PLATELET 171 09/07/2017 01:07 PM    MCV 75.9 (L) 09/07/2017 01:07 PM       Not surprisingly, she has kidney damage, likely from the high pressures. She was stage 3 as of hospital DC.     RIGHT KIDNEY:   Maximal length = 9.0 cm.  Cortical thickness = 12 mm.  The cortical-medullary  interface is maintained.  No hydronephrosis.  ??  Resistive Index = 0.63, 0.50, and 0.58.  (Normal Range = 0.5 - 0.7)  Acceleration Time = 54 ms and 34 ms.  (Normal Range < 100 ms)  Acceleration Index = 819 and 2082 cm/sec-sec.  (Normal Range > 300 cm/s)  ??  Renal Artery Velocity = 126, 142, and 175 cm/sec.    Ratio = 1.9.  (Normal Range < 3.5)  Renal vein is patent.  ??  LEFT KIDNEY:   Maximal length = 9.1 cm.  Cortical thickness = 12 mm.  The cortical-medullary  interface is maintained.  No hydronephrosis.  ??  Resistive Index = 0.54, 0.59, and 0.53.  (Normal Range = 0.5 - 0.7)  Acceleration Time = 34 ms and 27 ms.  (Normal Range < 100 ms)  Acceleration Index = '2 1 4 7 '$ and 1059 cm/sec-sec.  (Normal Range > 300 cm/s)  ??  Renal Artery Velocity = 80, 84, and 94 cm/sec.    Ratio = 1.0.  (Normal Range < 3.5)  Renal vein is patent.  ??  ABDOMEN:   Aortic velocity = 91 cm/sec.   Inferior Vena Cava is patent.  ??  IMPRESSION  IMPRESSION:    Right renal artery: No evidence of hemodynamically significant stenosis.  ??  Left renal artery: No evidence of hemodynamically significant stenosis.    EXAM/TECHNIQUE: Carotid and Vertebral Ultrasound Examination. Grayscale, Color  Doppler, and Spectral Doppler Ultrasound interrogation performed.  Peak Systolic  Velocity, ICA-to-CCA ratio, and End-Diastolic  Velocity are recorded.  The  estimate of stenosis is inferred from velocity measurements cross referenced to  published correlations as defined by the Society of Radiologists in [facility] 2003; 229; 340-346.    ??  COMPARISON: None..  ??  FINDINGS:  Peak systolic Velocities (in cm/s):  Right Common Carotid Artery: 78  Right Internal Carotid Artery: 101   Ratio = 1.3.    ??  Left Common Carotid Artery: 72  Left Internal Carotid Artery: 103   Ratio = 1.4.    ??  There are overall triphasic to biphasic waveforms in the right common carotid  and internal carotid arteries.  There is mild atherosclerotic disease of the right common carotid artery, right  carotid bulb, and right ICA origin.Marland Kitchen   Antegrade flow in the right vertebral artery.  ??  There are overall triphasic to biphasic waveforms in the left common carotid and  internal carotid arteries.  There is mild atherosclerotic disease of the left common carotid artery, left  carotid bulb, and left ICA origin.  Antegrade flow in the left vertebral artery.  ??  IMPRESSION  IMPRESSION:    RICA:  No evidence of hemodynamically significant stenosis.   ??  LICA:  No evidence of  hemodynamically significant stenosis. .    CT head without contrast  ??  History: hypertension and ? Abnormal CNS finding by PCP. Episode of bladder  incontinence.   ??  Technique: 80m axial images were obtained from the skull base to the vertex  without intravenous contrast.  Radiation dose reduction techniques were used for  this study:  Our CT scanners use one or all of the following: Automated exposure  control, adjustment of the mA and/or kVp according to patient's size, iterative  reconstruction.  ??  Comparison: None  ??  Findings: The ventricles and sulci are normal in size and configuration. There  are no extra-axial fluid collections. There is no evidence to suggest an acute  major territorial infarct. There is no evidence of acute intraparenchymal  hemorrhage or mass  effect. Patchy areas of decreased attenuation within the  supratentorial periventricular white matter would be most compatible with mild  chronic small vessel ischemic change. The bony calvarium is intact. The  visualized mastoid air cells and paranasal sinuses are well pneumatized and  aerated.  ??  IMPRESSION    Review of Systems:  Review of Systems   Constitutional: Positive for malaise/fatigue. Negative for chills and fever.   Respiratory: Negative for cough and shortness of breath.    Cardiovascular: Negative for chest pain and palpitations.   Gastrointestinal: Negative for abdominal pain, blood in stool, melena, nausea and vomiting.   Genitourinary: Negative for hematuria.       History:  Past Medical History:   Diagnosis Date   ??? Hypertension 50'S ?    PATIENT HAS BEEN NONCOMPLAINT WITH ANTIHYPERTENSIVE MEDS SINCE AT LEAST 2015   ??? Noncompliance        Past Surgical History:   Procedure Laterality Date   ??? HX ANKLE FRACTURE TX  2019    LEFT ANKLE FRACTURE WITH ORIF   ??? HX BILATERAL SALPINGO-OOPHORECTOMY     ??? HX HYSTERECTOMY     ??? HX TONSILLECTOMY         Family History   Problem Relation Age of Onset   ??? Hypertension Mother         AGE 73  ??? Heart Disease Mother    ??? Cancer Father         PROSTATE.  DIED AT 633  ??? Hypertension Sister         AND DIABETES   ??? Hypertension Brother    ??? Other Brother         DIED AT 153 MVA   ??? Drug Abuse Brother         DIED AT 630      Social History     Tobacco Use   ??? Smoking status: Never Smoker   ??? Smokeless tobacco: Never Used   Substance Use Topics   ??? Alcohol use: Never     Frequency: Never       Allergies   Allergen Reactions   ??? Opioids - Morphine Analogues Unknown (comments)     Had major altered state, felt like she was going to have a stroke   ??? Peanut Other (comments)     HEADACHE       Vitals:  Visit Vitals  BP (!) 162/97   Pulse 72   Ht 5' (1.524 m)   Wt 153 lb (69.4 kg)   BMI 29.88 kg/m??       Physical Exam:  Physical Exam   Constitutional: She is oriented to  person,  place, and time. She appears well-developed and well-nourished. No distress.   HENT:   Head: Normocephalic and atraumatic.   Nose: Nose normal.   Mouth/Throat: Oropharynx is clear and moist.   Eyes: Pupils are equal, round, and reactive to light. Conjunctivae and EOM are normal.   Neck: Normal range of motion. Neck supple.   Cardiovascular: Normal rate, regular rhythm and normal heart sounds. Exam reveals no gallop and no friction rub.   No murmur heard.  Pulmonary/Chest: Effort normal and breath sounds normal. No respiratory distress. She has no wheezes. She has no rales.   Abdominal: Soft. Bowel sounds are normal. She exhibits no distension and no mass. There is no tenderness. There is no rebound and no guarding.   Musculoskeletal: Normal range of motion. She exhibits no edema or tenderness.   Neurological: She is alert and oriented to person, place, and time. She has normal reflexes. No cranial nerve deficit. Coordination normal.   Skin: Skin is warm and dry. No rash noted. She is not diaphoretic. No erythema.   Psychiatric: She has a normal mood and affect. Her behavior is normal.   Nursing note and vitals reviewed.      Assessment and Plan:   1. Essential hypertension  Improving, but DBP still high, will add chlorthalidone. Recheck on FU.   - chlorthalidone (HYGROTEN) 25 mg tablet; Take 1 Tab by mouth daily.  Dispense: 90 Tab; Refill: 1  - METABOLIC PANEL, BASIC  - lisinopril (PRINIVIL, ZESTRIL) 40 mg tablet; Take 1 Tab by mouth daily.  Dispense: 90 Tab; Refill: 1    2. Medically noncompliant  She started telling me how she read that amlodipine increased the risk of MI. I told her very directly that her pressures as they have been will lead to heart failure and likely hemorrhagic stroke along with possible kidney failure and that she should be more worried about those things. She relented and agreed to take her medications.     3. Chronic kidney disease, stage 3 (HCC)  Will trend out renal function,  have not rechecked since she was in the hospital.   - METABOLIC PANEL, BASIC        FU 4w    Laurel Dimmer, MD

## 2017-09-24 LAB — METABOLIC PANEL, BASIC

## 2017-09-24 LAB — BASIC METABOLIC PANEL

## 2017-09-28 NOTE — Progress Notes (Signed)
Kidney function is slightly better, we are on our way to getting this fixed. Continue with current blood pressure meds.

## 2017-09-28 NOTE — Progress Notes (Signed)
Kidney function is slightly better, we are on our way to getting this fixed. Continue with current blood pressure meds.

## 2017-09-29 LAB — METABOLIC PANEL, BASIC
BUN/Creatinine ratio: 14 (ref 12–28)
BUN: 15 mg/dL (ref 8–27)
CO2: 28 mmol/L (ref 20–29)
Calcium: 10.1 mg/dL (ref 8.7–10.3)
Chloride: 98 mmol/L (ref 96–106)
Creatinine: 1.11 mg/dL — ABNORMAL HIGH (ref 0.57–1.00)
GFR est AA: 57 mL/min/{1.73_m2} — ABNORMAL LOW (ref >59)
GFR est non-AA: 50 mL/min/{1.73_m2} — ABNORMAL LOW (ref >59)
Glucose: 83 mg/dL (ref 65–99)
Potassium: 4.4 mmol/L (ref 3.5–5.2)
Sodium: 143 mmol/L (ref 134–144)

## 2017-09-29 LAB — SPECIMEN STATUS REPORT

## 2017-09-29 LAB — BASIC METABOLIC PANEL
BUN: 15 mg/dL (ref 8–27)
Bun/Cre Ratio: 14 NA (ref 12–28)
CO2: 28 mmol/L (ref 20–29)
Calcium: 10.1 mg/dL (ref 8.7–10.3)
Chloride: 98 mmol/L (ref 96–106)
Creatinine: 1.11 mg/dL — ABNORMAL HIGH (ref 0.57–1.00)
EGFR IF NonAfrican American: 50 mL/min/{1.73_m2} — ABNORMAL LOW (ref >59)
GFR African American: 57 mL/min/{1.73_m2} — ABNORMAL LOW (ref >59)
Glucose: 83 mg/dL (ref 65–99)
Potassium: 4.4 mmol/L (ref 3.5–5.2)
Sodium: 143 mmol/L (ref 134–144)

## 2017-10-20 ENCOUNTER — Encounter: Payer: PRIVATE HEALTH INSURANCE | Attending: Family Medicine | Primary: Family Medicine

## 2017-10-24 ENCOUNTER — Encounter: Payer: PRIVATE HEALTH INSURANCE | Attending: Family Medicine | Primary: Family Medicine

## 2017-11-15 ENCOUNTER — Ambulatory Visit: Attending: Family Medicine | Primary: Family Medicine

## 2017-11-15 ENCOUNTER — Ambulatory Visit
Admit: 2017-11-15 | Discharge: 2017-11-15 | Payer: PRIVATE HEALTH INSURANCE | Attending: Family Medicine | Primary: Family Medicine

## 2017-11-15 DIAGNOSIS — I10 Essential (primary) hypertension: Secondary | ICD-10-CM

## 2017-11-15 NOTE — Patient Instructions (Signed)
Heart-Healthy Diet: Care Instructions  Your Care Instructions    A heart-healthy diet has lots of vegetables, fruits, nuts, beans, and whole grains, and is low in salt. It limits foods that are high in saturated fat, such as meats, cheeses, and fried foods. It may be hard to change your diet, but even small changes can lower your risk of heart attack and heart disease.  Follow-up care is a key part of your treatment and safety. Be sure to make and go to all appointments, and call your doctor if you are having problems. It's also a good idea to know your test results and keep a list of the medicines you take.  How can you care for yourself at home?  Watch your portions  ?? Learn what a serving is. A "serving" and a "portion" are not always the same thing. Make sure that you are not eating larger portions than are recommended. For example, a serving of pasta is ?? cup. A serving size of meat is 2 to 3 ounces. A 3-ounce serving is about the size of a deck of cards. Measure serving sizes until you are good at "eyeballing" them. Keep in mind that restaurants often serve portions that are 2 or 3 times the size of one serving.  ?? To keep your energy level up and keep you from feeling hungry, eat often but in smaller portions.  ?? Eat only the number of calories you need to stay at a healthy weight. If you need to lose weight, eat fewer calories than your body burns (through exercise and other physical activity).  Eat more fruits and vegetables  ?? Eat a variety of fruit and vegetables every day. Dark green, deep orange, red, or yellow fruits and vegetables are especially good for you. Examples include spinach, carrots, peaches, and berries.  ?? Keep carrots, celery, and other veggies handy for snacks. Buy fruit that is in season and store it where you can see it so that you will be tempted to eat it.  ?? Cook dishes that have a lot of veggies in them, such as stir-fries and soups.  Limit saturated and trans fat   ?? Read food labels, and try to avoid saturated and trans fats. They increase your risk of heart disease. Trans fat is found in many processed foods such as cookies and crackers.  ?? Use olive or canola oil when you cook. Try cholesterol-lowering spreads, such as Benecol or Take Control.  ?? Bake, broil, grill, or steam foods instead of frying them.  ?? Choose lean meats instead of high-fat meats such as hot dogs and sausages. Cut off all visible fat when you prepare meat.  ?? Eat fish, skinless poultry, and meat alternatives such as soy products instead of high-fat meats. Soy products, such as tofu, may be especially good for your heart.  ?? Choose low-fat or fat-free milk and dairy products.  Eat fish  ?? Eat at least two servings of fish a week. Certain fish, such as salmon and tuna, contain omega-3 fatty acids, which may help reduce your risk of heart attack.  Eat foods high in fiber  ?? Eat a variety of grain products every day. Include whole-grain foods that have lots of fiber and nutrients. Examples of whole-grain foods include oats, whole wheat bread, and brown rice.  ?? Buy whole-grain breads and cereals, instead of white bread or pastries.  Limit salt and sodium  ?? Limit how much salt and sodium you eat to help   lower your blood pressure.  ?? Taste food before you salt it. Add only a little salt when you think you need it. With time, your taste buds will adjust to less salt.  ?? Eat fewer snack items, fast foods, and other high-salt, processed foods. Check food labels for the amount of sodium in packaged foods.  ?? Choose low-sodium versions of canned goods (such as soups, vegetables, and beans).  Limit sugar  ?? Limit drinks and foods with added sugar. These include candy, desserts, and soda pop.  Limit alcohol  ?? Limit alcohol to no more than 2 drinks a day for men and 1 drink a day for women. Too much alcohol can cause health problems.  When should you call for help?   Watch closely for changes in your health, and be sure to contact your doctor if:  ?? ?? You would like help planning heart-healthy meals.   Where can you learn more?  Go to http://www.healthwise.net/[employer].  Enter V137 in the search box to learn more about "Heart-Healthy Diet: Care Instructions."  Current as of: April 19, 2017  Content Version: 12.2  ?? 2006-2019 [employer]. Care instructions adapted under license by [employer] (which disclaims liability or warranty for this information). If you have questions about a medical condition or this instruction, always ask your healthcare professional. [employer]wise[employer] Inc[employer] disclaims any warranty or liability for your use of this information.

## 2017-11-15 NOTE — Progress Notes (Signed)
[facility]  _______________________________________  Laurie Marseilles, MD                 [address]        [place] Karl Luke, MD                     [place], [place] [address]                                                                                    Phone: [phone]                                                                                    Fax: [phone]    Laurie Richard is a 72 y.o. female who is seen for evaluation of   Chief Complaint   Patient presents with   ??? Hypertension         HPI:     Pt returns for hard to control HTN.     She is now on 25mg  chlorthalidone, lisinopril 40. She states adding norvasc got her to 90s/60s. Has been using it PRN. Bp had improved and we added chlorthalidone which I hoped would help more with DBP, however she is 170/120 on triage today. She states she takes her medication later in the day, will take lisinopril around 5pm. Will take chlorthalidone around 11pm. She states she is 120s/80s at home.     BP Readings from Last 5 Encounters:   11/15/17 (!) 170/120   09/22/17 (!) 162/97   09/15/17 (!) 179/97   09/09/17 131/79   09/07/17 (!) 210/131     Lab Results   Component Value Date/Time    Sodium 143 09/28/2017 09:35 AM    Potassium 4.4 09/28/2017 09:35 AM    Chloride 98 09/28/2017 09:35 AM    CO2 28 09/28/2017 09:35 AM    Anion gap 8 09/09/2017 10:04 AM    Glucose 83 09/28/2017 09:35 AM    BUN 15 09/28/2017 09:35 AM    Creatinine 1.11 (H) 09/28/2017 09:35 AM    BUN/Creatinine ratio 14 09/28/2017 09:35 AM    GFR est AA 57 (L) 09/28/2017 09:35 AM    GFR est non-AA 50 (L) 09/28/2017 09:35 AM    Calcium 10.1 09/28/2017 09:35 AM     Flu: Had this already.     Review of Systems:  Review of Systems   Constitutional: Negative for chills and fever.   Eyes: Negative for blurred vision.   Respiratory: Negative for cough and shortness of breath.    Cardiovascular: Negative for chest pain and palpitations.    Gastrointestinal: Negative for abdominal pain, blood in stool, melena, nausea and vomiting.   Genitourinary: Negative for hematuria.   Neurological: Negative for dizziness and headaches.       History:  Past Medical History:   Diagnosis Date   ??? Hypertension 50'S ?    PATIENT HAS BEEN NONCOMPLAINT WITH ANTIHYPERTENSIVE MEDS SINCE AT LEAST 2015   ??? Noncompliance        Past Surgical History:   Procedure Laterality Date   ??? HX ANKLE FRACTURE TX  2019    LEFT ANKLE FRACTURE WITH ORIF   ??? HX BILATERAL SALPINGO-OOPHORECTOMY     ??? HX HYSTERECTOMY     ??? HX TONSILLECTOMY         Family History   Problem Relation Age of Onset   ??? Hypertension Mother         AGE 63   ??? Heart Disease Mother    ??? Cancer Father         PROSTATE.  DIED AT 70   ??? Hypertension Sister         AND DIABETES   ??? Hypertension Brother    ??? Other Brother         DIED AT 21. MVA   ??? Drug Abuse Brother         DIED AT 57       Social History     Tobacco Use   ??? Smoking status: Never Smoker   ??? Smokeless tobacco: Never Used   Substance Use Topics   ??? Alcohol use: Never     Frequency: Never       Allergies   Allergen Reactions   ??? Opioids - Morphine Analogues Unknown (comments)     Had major altered state, felt like she was going to have a stroke   ??? Peanut Other (comments)     HEADACHE       Vitals:  Visit Vitals  BP (!) 170/120   Ht 5' (1.524 m)   Wt 154 lb (69.9 kg)   BMI 30.08 kg/m??       Physical Exam:  Physical Exam   Constitutional: She is oriented to person, place, and time. She appears well-developed and well-nourished. No distress.   HENT:   Head: Normocephalic and atraumatic.   Nose: Nose normal.   Mouth/Throat: Oropharynx is clear and moist.   Eyes: Pupils are equal, round, and reactive to light. Conjunctivae and EOM are normal.   Neck: Normal range of motion. Neck supple.   Cardiovascular: Normal rate, regular rhythm and normal heart sounds. Exam reveals no gallop and no friction rub.   No murmur heard.   Pulmonary/Chest: Effort normal and breath sounds normal. No respiratory distress. She has no wheezes. She has no rales.   Abdominal: Soft. Bowel sounds are normal. She exhibits no distension and no mass. There is no tenderness. There is no rebound and no guarding.   Musculoskeletal: Normal range of motion. She exhibits no edema or tenderness.   Neurological: She is alert and oriented to person, place, and time. She has normal reflexes. No cranial nerve deficit. Coordination normal.   Skin: Skin is warm and dry. No rash noted. She is not diaphoretic. No erythema.   Psychiatric: She has a normal mood and affect. Her behavior is normal.   Nursing note and vitals reviewed.      Assessment and Plan:   1. Essential hypertension  She has normal BP at home with documented hypotension when we added more medications. Probably a mix of white coat and noncompliance and control issues. She insists on taking whatever the directions are for medications and changing them (like what time of day we recommend taking  the pills). She denies any CP, blurred vision, HA. Will have her monitor BPs at home and bring home log with her next time.   - METABOLIC PANEL, BASIC    FU 46m    Roberts Gaudy, MD

## 2017-11-15 NOTE — Progress Notes (Signed)
[facility]  _______________________________________  Cathlean Marseilles, MD                 [address]        [place]. Karl Luke, MD                     [place], [place] [address]                                                                                    Phone: [phone]                                                                                    Fax: [phone]    Laurie Richard is a 72 y.o. female who is seen for evaluation of   Chief Complaint   Patient presents with   ??? Hypertension         HPI:     Pt returns for hard to control HTN.     She is now on 25mg  chlorthalidone, lisinopril 40. She states adding norvasc got her to 90s/60s. Has been using it PRN. Bp had improved and we added chlorthalidone which I hoped would help more with DBP, however she is 170/120 on triage today. She states she takes her medication later in the day, will take lisinopril around 5pm. Will take chlorthalidone around 11pm. She states she is 120s/80s at home.     BP Readings from Last 5 Encounters:   11/15/17 (!) 170/120   09/22/17 (!) 162/97   09/15/17 (!) 179/97   09/09/17 131/79   09/07/17 (!) 210/131     Lab Results   Component Value Date/Time    Sodium 143 09/28/2017 09:35 AM    Potassium 4.4 09/28/2017 09:35 AM    Chloride 98 09/28/2017 09:35 AM    CO2 28 09/28/2017 09:35 AM    Anion gap 8 09/09/2017 10:04 AM    Glucose 83 09/28/2017 09:35 AM    BUN 15 09/28/2017 09:35 AM    Creatinine 1.11 (H) 09/28/2017 09:35 AM    BUN/Creatinine ratio 14 09/28/2017 09:35 AM    GFR est AA 57 (L) 09/28/2017 09:35 AM    GFR est non-AA 50 (L) 09/28/2017 09:35 AM    Calcium 10.1 09/28/2017 09:35 AM     Flu: Had this already.     Review of Systems:  Review of Systems   Constitutional: Negative for chills and fever.   Eyes: Negative for blurred vision.   Respiratory: Negative for cough and shortness of breath.    Cardiovascular: Negative for chest pain and palpitations.   Gastrointestinal: Negative for  abdominal pain, blood in stool, melena, nausea and vomiting.   Genitourinary: Negative for hematuria.   Neurological: Negative for dizziness and headaches.       History:  Past Medical History:   Diagnosis Date   ??? Hypertension 50'S ?    PATIENT HAS BEEN NONCOMPLAINT WITH ANTIHYPERTENSIVE MEDS SINCE AT LEAST 2015   ??? Noncompliance        Past Surgical History:   Procedure Laterality Date   ??? HX ANKLE FRACTURE TX  2019    LEFT ANKLE FRACTURE WITH ORIF   ??? HX BILATERAL SALPINGO-OOPHORECTOMY     ??? HX HYSTERECTOMY     ??? HX TONSILLECTOMY         Family History   Problem Relation Age of Onset   ??? Hypertension Mother         AGE 22   ??? Heart Disease Mother    ??? Cancer Father         PROSTATE.  DIED AT 43   ??? Hypertension Sister         AND DIABETES   ??? Hypertension Brother    ??? Other Brother         DIED AT 34. MVA   ??? Drug Abuse Brother         DIED AT 68       Social History     Tobacco Use   ??? Smoking status: Never Smoker   ??? Smokeless tobacco: Never Used   Substance Use Topics   ??? Alcohol use: Never     Frequency: Never       Allergies   Allergen Reactions   ??? Opioids - Morphine Analogues Unknown (comments)     Had major altered state, felt like she was going to have a stroke   ??? Peanut Other (comments)     HEADACHE       Vitals:  Visit Vitals  BP (!) 170/120   Ht 5' (1.524 m)   Wt 154 lb (69.9 kg)   BMI 30.08 kg/m??       Physical Exam:  Physical Exam   Constitutional: She is oriented to person, place, and time. She appears well-developed and well-nourished. No distress.   HENT:   Head: Normocephalic and atraumatic.   Nose: Nose normal.   Mouth/Throat: Oropharynx is clear and moist.   Eyes: Pupils are equal, round, and reactive to light. Conjunctivae and EOM are normal.   Neck: Normal range of motion. Neck supple.   Cardiovascular: Normal rate, regular rhythm and normal heart sounds. Exam reveals no gallop and no friction rub.   No murmur heard.  Pulmonary/Chest: Effort normal and breath sounds normal. No respiratory  distress. She has no wheezes. She has no rales.   Abdominal: Soft. Bowel sounds are normal. She exhibits no distension and no mass. There is no tenderness. There is no rebound and no guarding.   Musculoskeletal: Normal range of motion. She exhibits no edema or tenderness.   Neurological: She is alert and oriented to person, place, and time. She has normal reflexes. No cranial nerve deficit. Coordination normal.   Skin: Skin is warm and dry. No rash noted. She is not diaphoretic. No erythema.   Psychiatric: She has a normal mood and affect. Her behavior is normal.   Nursing note and vitals reviewed.      Assessment and Plan:   1. Essential hypertension  She has normal BP at home with documented hypotension when we added more medications. Probably a mix of white coat and noncompliance and control issues. She insists on taking whatever the directions are for medications and changing them (like what time of day we recommend taking  the pills). She denies any CP, blurred vision, HA. Will have her monitor BPs at home and bring home log with her next time.   - METABOLIC PANEL, BASIC    FU 24m    Roberts Gaudy, MD

## 2017-11-16 LAB — METABOLIC PANEL, BASIC
BUN/Creatinine ratio: 16 (ref 12–28)
BUN: 14 mg/dL (ref 8–27)
CO2: 24 mmol/L (ref 20–29)
Calcium: 9.3 mg/dL (ref 8.7–10.3)
Chloride: 100 mmol/L (ref 96–106)
Creatinine: 0.89 mg/dL (ref 0.57–1.00)
GFR est AA: 74 mL/min/{1.73_m2} (ref >59)
GFR est non-AA: 65 mL/min/{1.73_m2} (ref >59)
Glucose: 77 mg/dL (ref 65–99)
Potassium: 3.7 mmol/L (ref 3.5–5.2)
Sodium: 140 mmol/L (ref 134–144)

## 2017-11-16 LAB — BASIC METABOLIC PANEL
BUN: 14 mg/dL (ref 8–27)
Bun/Cre Ratio: 16 NA (ref 12–28)
CO2: 24 mmol/L (ref 20–29)
Calcium: 9.3 mg/dL (ref 8.7–10.3)
Chloride: 100 mmol/L (ref 96–106)
Creatinine: 0.89 mg/dL (ref 0.57–1.00)
EGFR IF NonAfrican American: 65 mL/min/{1.73_m2} (ref >59)
GFR African American: 74 mL/min/{1.73_m2} (ref >59)
Glucose: 77 mg/dL (ref 65–99)
Potassium: 3.7 mmol/L (ref 3.5–5.2)
Sodium: 140 mmol/L (ref 134–144)

## 2017-12-31 ENCOUNTER — Encounter

## 2018-01-01 MED ORDER — LISINOPRIL 40 MG TAB
40 mg | ORAL_TABLET | Freq: Every day | ORAL | 1 refills | Status: DC
Start: 2018-01-01 — End: 2018-01-08

## 2018-01-02 ENCOUNTER — Encounter

## 2018-01-03 MED ORDER — LISINOPRIL 20 MG TAB
20 mg | ORAL_TABLET | Freq: Two times a day (BID) | ORAL | 11 refills | Status: DC
Start: 2018-01-03 — End: 2018-01-08

## 2018-01-05 NOTE — Telephone Encounter (Signed)
Problem with B P meds and needs new refill sent, wants 20 mg bid instead of 40 once a day

## 2018-01-08 MED ORDER — LISINOPRIL 40 MG TAB
40 mg | ORAL_TABLET | Freq: Every day | ORAL | 1 refills | Status: DC
Start: 2018-01-08 — End: 2019-02-06

## 2018-02-14 ENCOUNTER — Encounter: Attending: Family Medicine | Primary: Family Medicine

## 2018-02-20 ENCOUNTER — Encounter: Payer: PRIVATE HEALTH INSURANCE | Attending: Family Medicine | Primary: Family Medicine

## 2018-03-06 ENCOUNTER — Encounter: Payer: PRIVATE HEALTH INSURANCE | Attending: Family Medicine | Primary: Family Medicine

## 2018-03-22 ENCOUNTER — Encounter: Payer: PRIVATE HEALTH INSURANCE | Attending: Family Medicine | Primary: Family Medicine

## 2018-09-04 ENCOUNTER — Encounter: Payer: PRIVATE HEALTH INSURANCE | Attending: Family Medicine | Primary: Family Medicine

## 2018-09-05 ENCOUNTER — Telehealth: Attending: Family Medicine | Primary: Family Medicine

## 2018-09-05 ENCOUNTER — Telehealth
Admit: 2018-09-05 | Discharge: 2018-09-05 | Payer: PRIVATE HEALTH INSURANCE | Attending: Family Medicine | Primary: Family Medicine

## 2018-09-05 DIAGNOSIS — Z20822 Contact with and (suspected) exposure to covid-19: Secondary | ICD-10-CM

## 2018-09-05 DIAGNOSIS — Z20828 Contact with and (suspected) exposure to other viral communicable diseases: Secondary | ICD-10-CM

## 2018-09-05 NOTE — Progress Notes (Signed)
Laurie Richard is a 73 y.o. female, evaluated via audio-only technology on 09/05/2018 for No chief complaint on file.  .    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     Exposure: 08/29/18, PUI was already under the weather  Symptom Onset: None          Assessment & Plan:   1. Suspected COVID-19 virus infection  SHe is to remain quarantined until 9/1 or when her test comes back negative. Reviewed signs of early covid-19 and severe disease. She will report to ER with CP/SOB/AMS. Will get her scheduled for testing and will wait on results.   - NOVEL CORONAVIRUS (COVID-19)    FU PRN  12  Subjective:       Prior to Admission medications    Medication Sig Start Date End Date Taking? Authorizing Provider   lisinopril (PRINIVIL, ZESTRIL) 40 mg tablet Take 1 Tab by mouth daily. 01/08/18   Punam Broussard M, MD   amLODIPine (NORVASC) 5 mg tablet TAKE 1 TABLET BY MOUTH ONCE DAILY FOR 30 DAYS 10/19/17   Provider, Historical   chlorthalidone (HYGROTEN) 25 mg tablet Take 1 Tab by mouth daily. 09/22/17   Torunn Chancellor M, MD         Review of Systems   Constitutional: Negative for chills and fever.   Respiratory: Negative for cough and shortness of breath.    Cardiovascular: Negative for chest pain and palpitations.   Gastrointestinal: Negative for abdominal pain, blood in stool, melena, nausea and vomiting.   Genitourinary: Negative for hematuria.       No flowsheet data found.     Kamry Elizabeth Eckert, who was evaluated through a patient-initiated, synchronous (real-time) audio only encounter, and/or her healthcare decision maker, is aware that it is a billable service, with coverage as determined by her insurance carrier. She provided verbal consent to proceed: Yes. She has not had a related appointment within my department in the past 7 days or scheduled within the next 24 hours.      Total Time: minutes: 5-10 minutes    Kamilla Hands M Demontae Antunes, MD

## 2018-09-05 NOTE — Progress Notes (Signed)
Laurie Richard is a 73 y.o. female, evaluated via audio-only technology on 09/05/2018 for No chief complaint on file.  .    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     Exposure: 08/29/18, PUI was already under the weather  Symptom Onset: None          Assessment & Plan:   1. Suspected COVID-19 virus infection  SHe is to remain quarantined until 9/1 or when her test comes back negative. Reviewed signs of early covid-19 and severe disease. She will report to ER with CP/SOB/AMS. Will get her scheduled for testing and will wait on results.   - NOVEL CORONAVIRUS (COVID-19)    FU PRN  12  Subjective:       Prior to Admission medications    Medication Sig Start Date End Date Taking? Authorizing Provider   lisinopril (PRINIVIL, ZESTRIL) 40 mg tablet Take 1 Tab by mouth daily. 01/08/18   Laurel Dimmer, MD   amLODIPine (NORVASC) 5 mg tablet TAKE 1 TABLET BY MOUTH ONCE DAILY FOR 30 DAYS 10/19/17   Provider, Historical   chlorthalidone (HYGROTEN) 25 mg tablet Take 1 Tab by mouth daily. 09/22/17   Laurel Dimmer, MD         Review of Systems   Constitutional: Negative for chills and fever.   Respiratory: Negative for cough and shortness of breath.    Cardiovascular: Negative for chest pain and palpitations.   Gastrointestinal: Negative for abdominal pain, blood in stool, melena, nausea and vomiting.   Genitourinary: Negative for hematuria.       No flowsheet data found.     Nikola Blackston Bradfield, who was evaluated through a patient-initiated, synchronous (real-time) audio only encounter, and/or her healthcare decision maker, is aware that it is a billable service, with coverage as determined by her insurance carrier. She provided verbal consent to proceed: Yes. She has not had a related appointment within my department in the past 7 days or scheduled within the next 24 hours.      Total Time: minutes: 5-10 minutes    Laurel Dimmer, MD

## 2018-09-06 ENCOUNTER — Institutional Professional Consult (permissible substitution): Payer: PRIVATE HEALTH INSURANCE | Primary: Family Medicine

## 2018-09-06 DIAGNOSIS — Z1152 Encounter for screening for COVID-19: Secondary | ICD-10-CM

## 2018-09-06 NOTE — Progress Notes (Signed)
Patient presented to the [facility] for COVID testing as ordered.  After verbal consent given by patient/caregiver,  nasal swab obtained and sent to lab for processing.

## 2018-09-07 LAB — NOVEL CORONAVIRUS (COVID-19): SARS-CoV-2, NAA: NOT DETECTED

## 2018-09-07 LAB — COVID-19: SARS-CoV-2, NAA: NOT DETECTED

## 2019-01-22 NOTE — Telephone Encounter (Signed)
Dr C pt

## 2019-01-22 NOTE — Telephone Encounter (Signed)
Pt was dismissed from my previous practice for noncompliance, no shows, and endlessly harassing the on call doc in the middle of the night. Please get her off my pabel. Thanks.

## 2019-02-05 ENCOUNTER — Encounter: Payer: PRIVATE HEALTH INSURANCE | Attending: Family Medicine | Primary: Family Medicine

## 2019-02-06 ENCOUNTER — Ambulatory Visit: Attending: Family Medicine | Primary: Family Medicine

## 2019-02-06 ENCOUNTER — Ambulatory Visit
Admit: 2019-02-06 | Discharge: 2019-02-06 | Payer: PRIVATE HEALTH INSURANCE | Attending: Family Medicine | Primary: Family Medicine

## 2019-02-06 DIAGNOSIS — I10 Essential (primary) hypertension: Secondary | ICD-10-CM

## 2019-02-06 MED ORDER — LISINOPRIL 20 MG TAB
20 mg | ORAL_TABLET | Freq: Every day | ORAL | 1 refills | Status: DC
Start: 2019-02-06 — End: 2019-06-25

## 2019-02-06 MED ORDER — CHLORTHALIDONE 25 MG TAB
25 mg | ORAL_TABLET | Freq: Every day | ORAL | 1 refills | Status: AC
Start: 2019-02-06 — End: ?

## 2019-02-06 MED ORDER — AMLODIPINE 5 MG TAB
5 mg | ORAL_TABLET | ORAL | 3 refills | Status: DC
Start: 2019-02-06 — End: 2019-03-06

## 2019-02-06 NOTE — Progress Notes (Signed)
[facility]  _______________________________________  Eartha Inch, DO        Shirlyn Goltz, MD  Jaynee Eagles, NP    Kermit Balo, MD  Luberta Robertson, MD    [address] [place], [place] [address]  Phone: [phone]  Fax: [phone]    Laurie Richard (DOB: 28-Sep-1945) is a 74 y.o. female, established patient, here for evaluation of the following chief complaint(s):  Hypertension       ASSESSMENT/PLAN:  1. Essential hypertension  Not controlled. Pt wanted to argue with me about her medications. Refuses to take 40mg  of lisinopril a day. She will go home today on Lisiniopril 20, chlorthalidone 25, and Norvasc 5. She will take these every day as written and come back in a month for a recheck. She continued to want to argue with me that she knows better than I do how to control her pressure and I told her that if that's the case she should stop coming here and just treat herself. I told her that with her current pressures she is at risk for hemorrhagic stroke and that our goal is consistent BP < 140/90.   She will FU in a month for recheck BMP and BPs.   - METABOLIC PANEL, BASIC  - chlorthalidone (HYGROTON) 25 mg tablet; Take 1 Tab by mouth daily.  Dispense: 90 Tab; Refill: 1  - amLODIPine (NORVASC) 5 mg tablet; TAKE 1 TABLET BY MOUTH ONCE DAILY  Dispense: 90 Tab; Refill: 3  - lisinopriL (PRINIVIL, ZESTRIL) 20 mg tablet; Take 2 Tabs by mouth daily.  Dispense: 90 Tab; Refill: 1      SUBJECTIVE/OBJECTIVE:    Pt lost to FU for her HTN for over a year, no showed her FU with me last winter, then was lost to FU until she asked for a COVID test in august. Then she no showed September yesterday. Today she shows up 30 minutes late. She has historically hard to control HTN from what I believe is a lack of compliance. Was not asking for timely refills. She is supposed to be on chlorthalidone 25, amlodipine 5, and lisinopril 40. We nearly dismissed her from my last practice ([facility]) because she was  abusing the after hours line, calling multiple nights at 0200 for weeks because her "BP was up" and "lemon juice" wouldn't fix it. It is likely she was not taking her meds those days. Going over her chart is looks like noncompliance was added to her problem list back in August 2019.     At this point she is on lisinopril only. Needs the other two refilled. Denies HA, confusion, blurred vision. She states she has only been taking 20mg  of lisinopril. Has been skipping HCTZ and norvasc. States her BP is labile on this dose.     BP Readings from Last 3 Encounters:   02/06/19 (!) 180/120   11/15/17 (!) 170/120   09/22/17 (!) 162/97         Lab Results   Component Value Date/Time    Sodium 140 11/15/2017 02:43 PM    Potassium 3.7 11/15/2017 02:43 PM    Chloride 100 11/15/2017 02:43 PM    CO2 24 11/15/2017 02:43 PM    Anion gap 8 09/09/2017 10:04 AM    Glucose 77 11/15/2017 02:43 PM    BUN 14 11/15/2017 02:43 PM    Creatinine 0.89 11/15/2017 02:43 PM    BUN/Creatinine ratio 16 11/15/2017 02:43 PM    GFR est  AA 74 11/15/2017 02:43 PM    GFR est non-AA 65 11/15/2017 02:43 PM    Calcium 9.3 11/15/2017 02:43 PM         Review of Systems   Constitutional: Negative for chills and fever.   Respiratory: Negative for cough and shortness of breath.    Cardiovascular: Negative for chest pain and palpitations.   Gastrointestinal: Negative for abdominal pain, nausea and vomiting.       Physical Exam  Vitals signs and nursing note reviewed.   Constitutional:       Appearance: She is well-developed.   HENT:      Head: Normocephalic and atraumatic.   Neck:      Musculoskeletal: Normal range of motion and neck supple.   Cardiovascular:      Rate and Rhythm: Normal rate and regular rhythm.      Heart sounds: No murmur. No gallop.    Pulmonary:      Effort: Pulmonary effort is normal. No respiratory distress.      Breath sounds: Normal breath sounds. No wheezing or rales.   Abdominal:      General: Bowel sounds are normal. There is no  distension.      Palpations: Abdomen is soft.      Tenderness: There is no abdominal tenderness. There is no rebound.   Skin:     General: Skin is warm and dry.   Neurological:      Mental Status: She is alert and oriented to person, place, and time.               An electronic signature was used to authenticate this note.  -- Laurel Dimmer, MD

## 2019-02-06 NOTE — Progress Notes (Signed)
[facility]  _______________________________________  Cheron Schaumann, DO        Anastasio Auerbach, MD  Marthe Patch, NP    Park Breed, MD  Barbie [place] [address]  Phone: [phone]  Fax: [phone]    Laurie Richard (DOB: 10-16-1945) is a 74 y.o. female, established patient, here for evaluation of the following chief complaint(s):  Hypertension       ASSESSMENT/PLAN:  1. Essential hypertension  Not controlled. Pt wanted to argue with me about her medications. Refuses to take 40mg  of lisinopril a day. She will go home today on Lisiniopril 20, chlorthalidone 25, and Norvasc 5. She will take these every day as written and come back in a month for a recheck. She continued to want to argue with me that she knows better than I do how to control her pressure and I told her that if that's the case she should stop coming here and just treat herself. I told her that with her current pressures she is at risk for hemorrhagic stroke and that our goal is consistent BP < 140/90.   She will FU in a month for recheck BMP and BPs.   - METABOLIC PANEL, BASIC  - chlorthalidone (HYGROTON) 25 mg tablet; Take 1 Tab by mouth daily.  Dispense: 90 Tab; Refill: 1  - amLODIPine (NORVASC) 5 mg tablet; TAKE 1 TABLET BY MOUTH ONCE DAILY  Dispense: 90 Tab; Refill: 3  - lisinopriL (PRINIVIL, ZESTRIL) 20 mg tablet; Take 2 Tabs by mouth daily.  Dispense: 90 Tab; Refill: 1      SUBJECTIVE/OBJECTIVE:     Pt lost to FU for her HTN for over a year, no showed her FU with me last winter, then was lost to FU until she asked for a COVID test in august. Then she no showed [place] yesterday. Today she shows up 30 minutes late. She has historically hard to control HTN from what I believe is a lack of compliance. Was not asking for timely refills. She is supposed to be on chlorthalidone 25, amlodipine 5, and lisinopril 40. We nearly dismissed her from my last practice ([facility]) because she was abusing the after hours line, calling multiple nights at 0200 for weeks because her "BP was up" and "lemon juice" wouldn't fix it. It is likely she was not taking her meds those days. Going over her chart is looks like noncompliance was added to her problem list back in August 2019.     At this point she is on lisinopril only. Needs the other two refilled. Denies HA, confusion, blurred vision. She states she has only been taking 20mg  of lisinopril. Has been skipping HCTZ and norvasc. States her BP is labile on this dose.     BP Readings from Last 3 Encounters:   02/06/19 (!) 180/120   11/15/17 (!) 170/120   09/22/17 (!) 162/97         Lab Results   Component Value Date/Time    Sodium 140 11/15/2017 02:43 PM    Potassium 3.7 11/15/2017 02:43 PM    Chloride 100 11/15/2017 02:43 PM    CO2 24 11/15/2017 02:43 PM    Anion gap 8 09/09/2017 10:04 AM    Glucose 77 11/15/2017 02:43 PM    BUN 14 11/15/2017 02:43 PM    Creatinine 0.89 11/15/2017 02:43 PM    BUN/Creatinine ratio 16 11/15/2017 02:43 PM    GFR est  AA 74 11/15/2017 02:43 PM    GFR est non-AA 65 11/15/2017 02:43 PM    Calcium 9.3 11/15/2017 02:43 PM         Review of Systems   Constitutional: Negative for chills and fever.   Respiratory: Negative for cough and shortness of breath.    Cardiovascular: Negative for chest pain and palpitations.   Gastrointestinal: Negative for abdominal pain, nausea and vomiting.       Physical Exam  Vitals signs and nursing note reviewed.    Constitutional:       Appearance: She is well-developed.   HENT:      Head: Normocephalic and atraumatic.   Neck:      Musculoskeletal: Normal range of motion and neck supple.   Cardiovascular:      Rate and Rhythm: Normal rate and regular rhythm.      Heart sounds: No murmur. No gallop.    Pulmonary:      Effort: Pulmonary effort is normal. No respiratory distress.      Breath sounds: Normal breath sounds. No wheezing or rales.   Abdominal:      General: Bowel sounds are normal. There is no distension.      Palpations: Abdomen is soft.      Tenderness: There is no abdominal tenderness. There is no rebound.   Skin:     General: Skin is warm and dry.   Neurological:      Mental Status: She is alert and oriented to person, place, and time.               An electronic signature was used to authenticate this note.  -- Roberts Gaudy, MD

## 2019-02-07 LAB — METABOLIC PANEL, BASIC
BUN/Creatinine ratio: 11 — ABNORMAL LOW (ref 12–28)
BUN: 13 mg/dL (ref 8–27)
CO2: 25 mmol/L (ref 20–29)
Calcium: 9.5 mg/dL (ref 8.7–10.3)
Chloride: 102 mmol/L (ref 96–106)
Creatinine: 1.14 mg/dL — ABNORMAL HIGH (ref 0.57–1.00)
GFR est AA: 55 mL/min/{1.73_m2} — ABNORMAL LOW (ref >59)
GFR est non-AA: 47 mL/min/{1.73_m2} — ABNORMAL LOW (ref >59)
Glucose: 91 mg/dL (ref 65–99)
Potassium: 4.3 mmol/L (ref 3.5–5.2)
Sodium: 142 mmol/L (ref 134–144)

## 2019-02-07 LAB — BASIC METABOLIC PANEL
BUN: 13 mg/dL (ref 8–27)
Bun/Cre Ratio: 11 NA — ABNORMAL LOW (ref 12–28)
CO2: 25 mmol/L (ref 20–29)
Calcium: 9.5 mg/dL (ref 8.7–10.3)
Chloride: 102 mmol/L (ref 96–106)
Creatinine: 1.14 mg/dL — ABNORMAL HIGH (ref 0.57–1.00)
EGFR IF NonAfrican American: 47 mL/min/{1.73_m2} — ABNORMAL LOW (ref >59)
GFR African American: 55 mL/min/{1.73_m2} — ABNORMAL LOW (ref >59)
Glucose: 91 mg/dL (ref 65–99)
Potassium: 4.3 mmol/L (ref 3.5–5.2)
Sodium: 142 mmol/L (ref 134–144)

## 2019-03-06 ENCOUNTER — Ambulatory Visit: Attending: Family Medicine | Primary: Family Medicine

## 2019-03-06 ENCOUNTER — Ambulatory Visit
Admit: 2019-03-06 | Discharge: 2019-03-06 | Payer: PRIVATE HEALTH INSURANCE | Attending: Family Medicine | Primary: Family Medicine

## 2019-03-06 DIAGNOSIS — I10 Essential (primary) hypertension: Secondary | ICD-10-CM

## 2019-03-06 MED ORDER — SHINGRIX (PF) 50 MCG/0.5 ML INTRAMUSCULAR SUSPENSION, KIT
50 mcg/0.5 mL | Freq: Once | INTRAMUSCULAR | 1 refills | Status: AC
Start: 2019-03-06 — End: 2019-03-06

## 2019-03-06 NOTE — Patient Instructions (Signed)
Medicare Wellness Visit, Female     The best way to live healthy is to have a lifestyle where you eat a well-balanced diet, exercise regularly, limit alcohol use, and quit all forms of tobacco/nicotine, if applicable.     Regular preventive services are another way to keep healthy. Preventive services (vaccines, screening tests, monitoring & exams) can help personalize your care plan, which helps you manage your own care. Screening tests can find health problems at the earliest stages, when they are easiest to treat.   [facility] follows the current, evidence-based guidelines published by the [place] [employer] (USPSTF) when recommending preventive services for our patients. Because we follow these guidelines, sometimes recommendations change over time as research supports it. (For example, mammograms used to be recommended annually. Even though Medicare will still pay for an annual mammogram, the newer guidelines recommend a mammogram every two years for women of average risk).  Of course, you and your doctor may decide to screen more often for some diseases, based on your risk and your co-morbidities (chronic disease you are already diagnosed with).     Preventive services for you include:  - Medicare offers their members a free annual wellness visit, which is time for you and your primary care provider to discuss and plan for your preventive service needs. Take advantage of this benefit every year!  -All adults over the age of 24 should receive the recommended pneumonia vaccines. Current USPSTF guidelines recommend a series of two vaccines for the best pneumonia protection.   -All adults should have a flu vaccine yearly and a tetanus vaccine every 10 years.   -All adults age 68 and older should receive the shingles vaccines (series of two vaccines).      -All adults age 94-70 who are overweight should have a diabetes screening test once every three years.    -All adults born between [birth year] and 1965 should be screened once for Hepatitis C.  -Other screening tests and preventive services for persons with diabetes include: an eye exam to screen for diabetic retinopathy, a kidney function test, a foot exam, and stricter control over your cholesterol.   -Cardiovascular screening for adults with routine risk involves an electrocardiogram (ECG) at intervals determined by your doctor.   -Colorectal cancer screenings should be done for adults age 69-75 with no increased risk factors for colorectal cancer.  There are a number of acceptable methods of screening for this type of cancer. Each test has its own benefits and drawbacks. Discuss with your doctor what is most appropriate for you during your annual wellness visit. The different tests include: colonoscopy (considered the best screening method), a fecal occult blood test, a fecal DNA test, and sigmoidoscopy.    -A bone mass density test is recommended when a woman turns 65 to screen for osteoporosis. This test is only recommended one time, as a screening. Some providers will use this same test as a disease monitoring tool if you already have osteoporosis.  -Breast cancer screenings are recommended every other year for women of normal risk, age 33-74.  -Cervical cancer screenings for women over age 67 are only recommended with certain risk factors.     Here is a list of your current Health Maintenance items (your personalized list of preventive services) with a due date:  Health Maintenance Due   Topic Date Due   ??? Hepatitis C Test  [birth year]   ??? COVID-19 Vaccine (1 of 2) 10/27/1960   ???  DTaP/Tdap/Td  (1 - Tdap) 10/27/1965   ??? Shingles Vaccine (1 of 2) 10/28/1994   ??? Colorectal Screening  10/28/1994   ??? Mammogram  10/28/1994   ??? Glaucoma Screening   10/27/2009   ??? Bone Mineral Density   10/27/2009   ??? Annual Well Visit  09/08/2018   ??? Yearly Flu Vaccine (1) 09/12/2018

## 2019-03-06 NOTE — Progress Notes (Signed)
This is the Subsequent Medicare Annual Wellness Exam, performed 12 months or more after the Initial AWV or the last Subsequent AWV    I have reviewed the patient's medical history in detail and updated the computerized patient record.     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 Compliance had been an issue in the past, so it turns out per her that this was too much BP meds. SHe stopped taking the norvasc because BP got too low and she ended up leveling out at 130s/70s at home on this regimen. She is a little anxious about her BP here today. It is improved slightly on my manometer. She states she takes her meds in the evenings.     BP Readings from Last 3 Encounters:   03/06/19 (!) 178/108   02/06/19 (!) 180/120   11/15/17 (!) 170/120     Lab Results   Component Value Date/Time    Sodium 142 02/06/2019 09:36 AM    Potassium 4.3 02/06/2019 09:36 AM    Chloride 102 02/06/2019 09:36 AM    CO2 25 02/06/2019 09:36 AM    Anion gap 8 09/09/2017 10:04 AM    Glucose 91 02/06/2019 09:36 AM    BUN 13 02/06/2019 09:36 AM    Creatinine 1.14 (H) 02/06/2019 09:36 AM    BUN/Creatinine ratio 11 (L) 02/06/2019 09:36 AM    GFR est AA 55 (L) 02/06/2019 09:36 AM    GFR est non-AA 47 (L) 02/06/2019 09:36 AM    Calcium 9.5 02/06/2019 09:36 AM       HM:  Colo: Has a FIT test from her insurance company sitting at her house.   Mammo: Declines this   Covid:  Would like to get this  Shingrix: Would like to think about this    Depression Risk Factor Screening:     3 most recent PHQ Screens 03/06/2019   Little interest or pleasure in doing things Not at all   Feeling down, depressed, irritable, or hopeless Not at all   Total Score PHQ 2 0       Alcohol Risk Screen    Do you average more than 1 drink per night or more than 7 drinks a week:  No    On any one occasion in the past three months have you have had more than 3 drinks containing alcohol:  No        Functional Ability and Level of Safety:     Hearing: Hearing is good.      Activities of Daily Living:  The home contains: no safety equipment.  Patient does total self care      Ambulation: with no difficulty     Fall Risk:  Fall Risk Assessment, last 12 mths 03/06/2019   Able to walk? Yes   Fall in past 12 months? 0   Do you feel unsteady? 0   Are you worried about falling 0      Abuse Screen:  Patient is not abused       Cognitive Screening    Has your family/caregiver stated any concerns about your memory: no     Cognitive Screening: Normal - Mini Cog Test    Review of Systems   Constitutional: Negative for chills and fever.   Respiratory: Negative for cough and shortness of breath.    Cardiovascular: Negative for chest pain and palpitations.   Gastrointestinal: Negative for abdominal pain, blood in stool, melena, nausea and vomiting.  Genitourinary: Negative for hematuria.     Physical Exam  Vitals signs and nursing note reviewed.   Constitutional:       Appearance: She is well-developed.   HENT:      Head: Normocephalic and atraumatic.      Right Ear: External ear normal.      Left Ear: External ear normal.   Neck:      Musculoskeletal: Normal range of motion and neck supple.   Cardiovascular:      Rate and Rhythm: Normal rate and regular rhythm.      Heart sounds: No murmur. No gallop.    Pulmonary:      Effort: Pulmonary effort is normal. No respiratory distress.      Breath sounds: Normal breath sounds. No wheezing or rales.   Abdominal:      General: Bowel sounds are normal. There is no distension.      Palpations: Abdomen is soft.      Tenderness: There is no abdominal tenderness. There is no rebound.   Skin:     General: Skin is warm and dry.   Neurological:      Mental Status: She is alert and oriented to person, place, and time.   Psychiatric:         Mood and Affect: Mood normal.         Behavior: Behavior normal.         Thought Content: Thought content normal.           Assessment/Plan   Education and counseling provided:  Are appropriate  based on today's review and evaluation    1. Essential hypertension  BP high here, but making inroads at home. At this point after the argument we had her last visit just to get her to take her meds, will let it ride. Continue checking BP at home and keeping a log. There is likely some white coat syndrome at play. Will monitor.   - METABOLIC PANEL, BASIC    2. Stage 3a chronic kidney disease  Stable, continue current regimen.     - METABOLIC PANEL, BASIC    3. Encounter for annual wellness exam in Medicare patient  Refuses mammo/colo. I asked her to send in the FIT kit she has at home to at least clear for colon cancer. She will continue doing self breast exams in the shower. Relatively low risk, her mother is 17, sisters are still living, no breast cancer history. She will let me know if she finds any lumps.   Was willing to consider shingrix vaccine, printed that for her.   Also gave her info on signing up to get COVID vax.   - varicella-zoster recombinant, PF, (Shingrix, PF,) 50 mcg/0.5 mL susr injection; 0.5 mL by IntraMUSCular route once for 1 dose.  Dispense: 0.5 mL; Refill: 1    4. Encounter for immunization    - varicella-zoster recombinant, PF, (Shingrix, PF,) 50 mcg/0.5 mL susr injection; 0.5 mL by IntraMUSCular route once for 1 dose.  Dispense: 0.5 mL; Refill: 1    FU 57m   Health Maintenance Due     Health Maintenance Due   Topic Date Due   ??? Hepatitis C Screening  11946/02/25  ??? COVID-19 Vaccine (1 of 2) 10/27/1960   ??? DTaP/Tdap/Td series (1 - Tdap) 10/27/1965   ??? Shingrix Vaccine Age 38> (1 of 2) 10/28/1994   ??? Colorectal Cancer Screening Combo  10/28/1994   ??? Breast Cancer Screen Mammogram  10/28/1994   ???  GLAUCOMA SCREENING Q2Y  10/27/2009   ??? Bone Densitometry (Dexa) Screening  10/27/2009   ??? Flu Vaccine (1) 09/12/2018       Patient Care Team   Patient Care Team:  Malgorzata Albert, Marti Sleigh, MD as PCP - General (Family Medicine)  [name], Marti Sleigh, MD as PCP - [facility] Empaneled Provider    History     Patient Active  Problem List   Diagnosis Code   ??? Essential hypertension I10   ??? Allergic rhinitis J30.9   ??? Dysfunction of both eustachian tubes H69.83   ??? Class 1 obesity due to excess calories with body mass index (BMI) of 30.0 to 30.9 in adult E66.09, Z68.30   ??? Abscessed tooth K04.7   ??? Encounter for immunization Z23   ??? Medically noncompliant Z91.19   ??? Hypertensive urgency I16.0   ??? Ventricular ectopy I49.3   ??? Chronic kidney disease, stage 3 N18.30     Past Medical History:   Diagnosis Date   ??? Hypertension 50'S ?    PATIENT HAS BEEN NONCOMPLAINT WITH ANTIHYPERTENSIVE MEDS SINCE AT LEAST 2015   ??? Noncompliance       Past Surgical History:   Procedure Laterality Date   ??? HX ANKLE FRACTURE TX  2019    LEFT ANKLE FRACTURE WITH ORIF   ??? HX BILATERAL SALPINGO-OOPHORECTOMY     ??? HX HYSTERECTOMY     ??? HX TONSILLECTOMY       Current Outpatient Medications   Medication Sig Dispense Refill   ??? varicella-zoster recombinant, PF, (Shingrix, PF,) 50 mcg/0.5 mL susr injection 0.5 mL by IntraMUSCular route once for 1 dose. 0.5 mL 1   ??? chlorthalidone (HYGROTON) 25 mg tablet Take 1 Tab by mouth daily. 90 Tab 1   ??? lisinopriL (PRINIVIL, ZESTRIL) 20 mg tablet Take 2 Tabs by mouth daily. 90 Tab 1     Allergies   Allergen Reactions   ??? Opioids - Morphine Analogues Unknown (comments)     Had major altered state, felt like she was going to have a stroke   ??? Peanut Other (comments)     HEADACHE       Family History   Problem Relation Age of Onset   ??? Hypertension Mother         AGE 21   ??? Heart Disease Mother    ??? Cancer Father         PROSTATE.  DIED AT 33   ??? Hypertension Sister         AND DIABETES   ??? Hypertension Brother    ??? Other Brother         DIED AT 26. MVA   ??? Drug Abuse Brother         DIED AT 10     Social History     Tobacco Use   ??? Smoking status: Never Smoker   ??? Smokeless tobacco: Never Used   Substance Use Topics   ??? Alcohol use: Never     Frequency: Never

## 2019-03-06 NOTE — Progress Notes (Signed)
This is the Subsequent Medicare Annual Wellness Exam, performed 12 months or more after the Initial AWV or the last Subsequent AWV    I have reviewed the patient's medical history in detail and updated the computerized patient record.     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 Compliance had been an issue in the past, so it turns out per her that this was too much BP meds. SHe stopped taking the norvasc because BP got too low and she ended up leveling out at 130s/70s at home on this regimen. She is a little anxious about her BP here today. It is improved slightly on my manometer. She states she takes her meds in the evenings.     BP Readings from Last 3 Encounters:   03/06/19 (!) 178/108   02/06/19 (!) 180/120   11/15/17 (!) 170/120     Lab Results   Component Value Date/Time    Sodium 142 02/06/2019 09:36 AM    Potassium 4.3 02/06/2019 09:36 AM    Chloride 102 02/06/2019 09:36 AM    CO2 25 02/06/2019 09:36 AM    Anion gap 8 09/09/2017 10:04 AM    Glucose 91 02/06/2019 09:36 AM    BUN 13 02/06/2019 09:36 AM    Creatinine 1.14 (H) 02/06/2019 09:36 AM    BUN/Creatinine ratio 11 (L) 02/06/2019 09:36 AM    GFR est AA 55 (L) 02/06/2019 09:36 AM    GFR est non-AA 47 (L) 02/06/2019 09:36 AM    Calcium 9.5 02/06/2019 09:36 AM       HM:  Colo: Has a FIT test from her insurance company sitting at her house.   Mammo: Declines this   Covid:  Would like to get this  Shingrix: Would like to think about this    Depression Risk Factor Screening:     3 most recent PHQ Screens 03/06/2019   Little interest or pleasure in doing things Not at all   Feeling down, depressed, irritable, or hopeless Not at all   Total Score PHQ 2 0       Alcohol Risk Screen    Do you average more than 1 drink per night or more than 7 drinks a week:  No    On any one occasion in the past three months have you have had more than 3 drinks containing alcohol:  No        Functional Ability and Level of Safety:     Hearing: Hearing is good.      Activities of Daily Living:  The home contains: no safety equipment.  Patient does total self care      Ambulation: with no difficulty     Fall Risk:  Fall Risk Assessment, last 12 mths 03/06/2019   Able to walk? Yes   Fall in past 12 months? 0   Do you feel unsteady? 0   Are you worried about falling 0      Abuse Screen:  Patient is not abused       Cognitive Screening    Has your family/caregiver stated any concerns about your memory: no     Cognitive Screening: Normal - Mini Cog Test    Review of Systems   Constitutional: Negative for chills and fever.   Respiratory: Negative for cough and shortness of breath.    Cardiovascular: Negative for chest pain and palpitations.   Gastrointestinal: Negative for abdominal pain, blood in stool, melena, nausea and vomiting.  Genitourinary: Negative for hematuria.     Physical Exam  Vitals signs and nursing note reviewed.   Constitutional:       Appearance: She is well-developed.   HENT:      Head: Normocephalic and atraumatic.      Right Ear: External ear normal.      Left Ear: External ear normal.   Neck:      Musculoskeletal: Normal range of motion and neck supple.   Cardiovascular:      Rate and Rhythm: Normal rate and regular rhythm.      Heart sounds: No murmur. No gallop.    Pulmonary:      Effort: Pulmonary effort is normal. No respiratory distress.      Breath sounds: Normal breath sounds. No wheezing or rales.   Abdominal:      General: Bowel sounds are normal. There is no distension.      Palpations: Abdomen is soft.      Tenderness: There is no abdominal tenderness. There is no rebound.   Skin:     General: Skin is warm and dry.   Neurological:      Mental Status: She is alert and oriented to person, place, and time.   Psychiatric:         Mood and Affect: Mood normal.         Behavior: Behavior normal.         Thought Content: Thought content normal.           Assessment/Plan   Education and counseling provided:   Are appropriate based on today's review and evaluation    1. Essential hypertension  BP high here, but making inroads at home. At this point after the argument we had her last visit just to get her to take her meds, will let it ride. Continue checking BP at home and keeping a log. There is likely some white coat syndrome at play. Will monitor.   - METABOLIC PANEL, BASIC    2. Stage 3a chronic kidney disease  Stable, continue current regimen.     - METABOLIC PANEL, BASIC    3. Encounter for annual wellness exam in Medicare patient  Refuses mammo/colo. I asked her to send in the FIT kit she has at home to at least clear for colon cancer. She will continue doing self breast exams in the shower. Relatively low risk, her mother is 44, sisters are still living, no breast cancer history. She will let me know if she finds any lumps.   Was willing to consider shingrix vaccine, printed that for her.   Also gave her info on signing up to get COVID vax.   - varicella-zoster recombinant, PF, (Shingrix, PF,) 50 mcg/0.5 mL susr injection; 0.5 mL by IntraMUSCular route once for 1 dose.  Dispense: 0.5 mL; Refill: 1    4. Encounter for immunization    - varicella-zoster recombinant, PF, (Shingrix, PF,) 50 mcg/0.5 mL susr injection; 0.5 mL by IntraMUSCular route once for 1 dose.  Dispense: 0.5 mL; Refill: 1    FU 52m   Health Maintenance Due     Health Maintenance Due   Topic Date Due   ??? Hepatitis C Screening  1November 02, 1947  ??? COVID-19 Vaccine (1 of 2) 10/27/1960   ??? DTaP/Tdap/Td series (1 - Tdap) 10/27/1965   ??? Shingrix Vaccine Age 20> (1 of 2) 10/28/1994   ??? Colorectal Cancer Screening Combo  10/28/1994   ??? Breast Cancer Screen Mammogram  10/28/1994   ???  GLAUCOMA SCREENING Q2Y  10/27/2009   ??? Bone Densitometry (Dexa) Screening  10/27/2009   ??? Flu Vaccine (1) 09/12/2018       Patient Care Team   Patient Care Team:  Nadiah Corbit, Marti Sleigh, MD as PCP - General (Family Medicine)  [name], Marti Sleigh, MD as PCP - [facility] Empaneled Provider    History      Patient Active Problem List   Diagnosis Code   ??? Essential hypertension I10   ??? Allergic rhinitis J30.9   ??? Dysfunction of both eustachian tubes H69.83   ??? Class 1 obesity due to excess calories with body mass index (BMI) of 30.0 to 30.9 in adult E66.09, Z68.30   ??? Abscessed tooth K04.7   ??? Encounter for immunization Z23   ??? Medically noncompliant Z91.19   ??? Hypertensive urgency I16.0   ??? Ventricular ectopy I49.3   ??? Chronic kidney disease, stage 3 N18.30     Past Medical History:   Diagnosis Date   ??? Hypertension 50'S ?    PATIENT HAS BEEN NONCOMPLAINT WITH ANTIHYPERTENSIVE MEDS SINCE AT LEAST 2015   ??? Noncompliance       Past Surgical History:   Procedure Laterality Date   ??? HX ANKLE FRACTURE TX  2019    LEFT ANKLE FRACTURE WITH ORIF   ??? HX BILATERAL SALPINGO-OOPHORECTOMY     ??? HX HYSTERECTOMY     ??? HX TONSILLECTOMY       Current Outpatient Medications   Medication Sig Dispense Refill   ??? varicella-zoster recombinant, PF, (Shingrix, PF,) 50 mcg/0.5 mL susr injection 0.5 mL by IntraMUSCular route once for 1 dose. 0.5 mL 1   ??? chlorthalidone (HYGROTON) 25 mg tablet Take 1 Tab by mouth daily. 90 Tab 1   ??? lisinopriL (PRINIVIL, ZESTRIL) 20 mg tablet Take 2 Tabs by mouth daily. 90 Tab 1     Allergies   Allergen Reactions   ??? Opioids - Morphine Analogues Unknown (comments)     Had major altered state, felt like she was going to have a stroke   ??? Peanut Other (comments)     HEADACHE       Family History   Problem Relation Age of Onset   ??? Hypertension Mother         AGE 27   ??? Heart Disease Mother    ??? Cancer Father         PROSTATE.  DIED AT 60   ??? Hypertension Sister         AND DIABETES   ??? Hypertension Brother    ??? Other Brother         DIED AT 72. MVA   ??? Drug Abuse Brother         DIED AT 36     Social History     Tobacco Use   ??? Smoking status: Never Smoker   ??? Smokeless tobacco: Never Used   Substance Use Topics   ??? Alcohol use: Never     Frequency: Never

## 2019-03-07 LAB — METABOLIC PANEL, BASIC
BUN/Creatinine ratio: 11 — ABNORMAL LOW (ref 12–28)
BUN: 12 mg/dL (ref 8–27)
CO2: 27 mmol/L (ref 20–29)
Calcium: 9.5 mg/dL (ref 8.7–10.3)
Chloride: 97 mmol/L (ref 96–106)
Creatinine: 1.07 mg/dL — ABNORMAL HIGH (ref 0.57–1.00)
GFR est AA: 59 mL/min/{1.73_m2} — ABNORMAL LOW (ref >59)
GFR est non-AA: 51 mL/min/{1.73_m2} — ABNORMAL LOW (ref >59)
Glucose: 85 mg/dL (ref 65–99)
Potassium: 3.9 mmol/L (ref 3.5–5.2)
Sodium: 139 mmol/L (ref 134–144)

## 2019-03-07 LAB — BASIC METABOLIC PANEL
BUN: 12 mg/dL (ref 8–27)
Bun/Cre Ratio: 11 NA — ABNORMAL LOW (ref 12–28)
CO2: 27 mmol/L (ref 20–29)
Calcium: 9.5 mg/dL (ref 8.7–10.3)
Chloride: 97 mmol/L (ref 96–106)
Creatinine: 1.07 mg/dL — ABNORMAL HIGH (ref 0.57–1.00)
EGFR IF NonAfrican American: 51 mL/min/{1.73_m2} — ABNORMAL LOW (ref >59)
GFR African American: 59 mL/min/{1.73_m2} — ABNORMAL LOW (ref >59)
Glucose: 85 mg/dL (ref 65–99)
Potassium: 3.9 mmol/L (ref 3.5–5.2)
Sodium: 139 mmol/L (ref 134–144)

## 2019-06-25 ENCOUNTER — Encounter

## 2019-06-25 MED ORDER — LISINOPRIL 40 MG TAB
40 mg | ORAL_TABLET | Freq: Every day | ORAL | 1 refills | Status: DC
Start: 2019-06-25 — End: 2019-07-12

## 2019-06-25 NOTE — Telephone Encounter (Signed)
Last Ov 03-06-19

## 2019-07-12 ENCOUNTER — Encounter

## 2019-07-12 MED ORDER — LISINOPRIL 20 MG TAB
20 mg | ORAL_TABLET | Freq: Every day | ORAL | 1 refills | Status: AC
Start: 2019-07-12 — End: ?

## 2019-07-12 NOTE — Telephone Encounter (Signed)
Wrong dose was sent in. Patient is currently taking lisinopril 20 mg.

## 2019-09-04 ENCOUNTER — Encounter: Payer: PRIVATE HEALTH INSURANCE | Attending: Family Medicine | Primary: Family Medicine

## 2020-03-19 ENCOUNTER — Other Ambulatory Visit: Payer: Self-pay | Admitting: Family Medicine

## 2020-03-19 DIAGNOSIS — R5381 Other malaise: Secondary | ICD-10-CM

## 2020-06-03 ENCOUNTER — Emergency Department (HOSPITAL_COMMUNITY): Payer: Medicare HMO

## 2020-06-03 ENCOUNTER — Encounter (HOSPITAL_COMMUNITY): Payer: Self-pay

## 2020-06-03 ENCOUNTER — Emergency Department (HOSPITAL_COMMUNITY)
Admission: EM | Admit: 2020-06-03 | Discharge: 2020-06-04 | Disposition: A | Discharge status: Home / Self Care | Payer: Medicare HMO | Attending: Emergency Medicine | Admitting: Emergency Medicine

## 2020-06-03 ENCOUNTER — Other Ambulatory Visit: Payer: Self-pay

## 2020-06-03 DIAGNOSIS — F17200 Nicotine dependence, unspecified, uncomplicated: Secondary | ICD-10-CM | POA: Diagnosis not present

## 2020-06-03 DIAGNOSIS — M79602 Pain in left arm: Secondary | ICD-10-CM | POA: Insufficient documentation

## 2020-06-03 DIAGNOSIS — I10 Essential (primary) hypertension: Secondary | ICD-10-CM | POA: Diagnosis not present

## 2020-06-03 DIAGNOSIS — R079 Chest pain, unspecified: Secondary | ICD-10-CM

## 2020-06-03 HISTORY — DX: Essential (primary) hypertension: I10

## 2020-06-03 LAB — CBC WITH DIFFERENTIAL/PLATELET
Abs Immature Granulocytes: 0.01 10*3/uL (ref 0.00–0.07)
Basophils Absolute: 0.1 10*3/uL (ref 0.0–0.1)
Basophils Relative: 1 %
Eosinophils Absolute: 0.1 10*3/uL (ref 0.0–0.5)
Eosinophils Relative: 2 %
HCT: 42.1 % (ref 36.0–46.0)
Hemoglobin: 13.2 g/dL (ref 12.0–15.0)
Immature Granulocytes: 0 %
Lymphocytes Relative: 48 %
Lymphs Abs: 2.2 10*3/uL (ref 0.7–4.0)
MCH: 23.9 pg — ABNORMAL LOW (ref 26.0–34.0)
MCHC: 31.4 g/dL (ref 30.0–36.0)
MCV: 76.1 fL — ABNORMAL LOW (ref 80.0–100.0)
Monocytes Absolute: 0.5 10*3/uL (ref 0.1–1.0)
Monocytes Relative: 12 %
Neutro Abs: 1.7 10*3/uL (ref 1.7–7.7)
Neutrophils Relative %: 37 %
Platelets: 210 10*3/uL (ref 150–400)
RBC: 5.53 MIL/uL — ABNORMAL HIGH (ref 3.87–5.11)
RDW: 18.3 % — ABNORMAL HIGH (ref 11.5–15.5)
WBC: 4.6 10*3/uL (ref 4.0–10.5)
nRBC: 0 % (ref 0.0–0.2)

## 2020-06-03 LAB — BASIC METABOLIC PANEL
Anion gap: 8 (ref 5–15)
BUN: 11 mg/dL (ref 8–23)
CO2: 24 mmol/L (ref 22–32)
Calcium: 8.9 mg/dL (ref 8.9–10.3)
Chloride: 108 mmol/L (ref 98–111)
Creatinine, Ser: 0.96 mg/dL (ref 0.44–1.00)
GFR, Estimated: >60 mL/min (ref >60)
Glucose, Bld: 94 mg/dL (ref 70–99)
Potassium: 3.8 mmol/L (ref 3.5–5.1)
Sodium: 140 mmol/L (ref 135–145)

## 2020-06-03 LAB — TROPONIN I (HIGH SENSITIVITY)
Troponin I (High Sensitivity): 6 ng/L (ref <18)
Troponin I (High Sensitivity): 6 ng/L (ref <18)

## 2020-06-03 MED ORDER — CLONIDINE HCL 0.1 MG PO TABS: 0.1000 mg | ORAL_TABLET | Freq: Once | ORAL | Status: DC

## 2020-06-03 MED ORDER — IOHEXOL 350 MG/ML SOLN
100.0000 mL | Freq: Once | INTRAVENOUS | Status: AC | PRN
  Administered 2020-06-03: 100 mL via INTRAVENOUS

## 2020-06-03 MED ORDER — LABETALOL HCL 5 MG/ML IV SOLN
5.0000 mg | Freq: Once | INTRAVENOUS | Status: AC
  Administered 2020-06-03: 5 mg via INTRAVENOUS
  Filled 2020-06-03: qty 4

## 2020-06-03 MED ORDER — SODIUM CHLORIDE 0.9 % IV SOLN: INTRAVENOUS | Status: DC

## 2020-06-03 MED ORDER — LABETALOL HCL 5 MG/ML IV SOLN
10.0000 mg | Freq: Once | INTRAVENOUS | Status: AC
  Administered 2020-06-04: 10 mg via INTRAVENOUS

## 2020-06-03 NOTE — ED Provider Notes (Signed)
[facility]-EMERGENCY DEPT Provider Note   CSN: [id number] Arrival date & time: 06/03/20  1819     History Chief Complaint  Patient presents with  . Arm Pain    Chelsey Carpenter is a 75 y.o. female.  75 year old female presents with increased blood pressure times several weeks.  Patient takes lisinopril 30 mg a day for this.  Has not had any associated chest pain or shortness of breath.  No weakness to her left arm.  Saw her doctor today and blood pressure was elevated with systolic of 190 diastolic of 123.  Was treated with quinidine and blood pressure did slightly improve.  X-ray in the office according to the patient showed an issue with a order potentially.  She did have an episode of left arm discomfort lasting for several minutes without associated diaphoresis, nausea, dyspnea.  She has been prescribed a new blood pressure medication but has not had prescription filled yet        Past Medical History:  Diagnosis Date  . Hypertension     There are no problems to display for this patient.   Past Surgical History:  Procedure Laterality Date  . ABDOMINAL HYSTERECTOMY    . CYSTECTOMY    . TONSILLECTOMY       OB History   No obstetric history on file.     History reviewed. No pertinent family history.  Social History   Tobacco Use  . Smoking status: Current Every Day Smoker  . Smokeless tobacco: Never Used  Vaping Use  . Vaping Use: Never used  Substance Use Topics  . Alcohol use: Never  . Drug use: Never    Home Medications Prior to Admission medications   Not on File    Allergies    Patient has no known allergies.  Review of Systems   Review of Systems  All other systems reviewed and are negative.   Physical Exam Updated Vital Signs BP (!) 216/101 (BP Location: Left Arm)   Pulse 68   Temp 98.3 F (36.8 C) (Oral)   Resp 15   Ht 1.524 m (5')   Wt 73.5 kg   SpO2 100%   BMI 31.64 kg/m   Physical Exam Vitals and  nursing note reviewed.  Constitutional:      General: She is not in acute distress.    Appearance: Normal appearance. She is well-developed. She is not toxic-appearing.  HENT:     Head: Normocephalic and atraumatic.  Eyes:     General: Lids are normal.     Conjunctiva/sclera: Conjunctivae normal.     Pupils: Pupils are equal, round, and reactive to light.  Neck:     Thyroid: No thyroid mass.     Trachea: No tracheal deviation.  Cardiovascular:     Rate and Rhythm: Normal rate and regular rhythm.     Heart sounds: Normal heart sounds. No murmur heard. No gallop.   Pulmonary:     Effort: Pulmonary effort is normal. No respiratory distress.     Breath sounds: Normal breath sounds. No stridor. No decreased breath sounds, wheezing, rhonchi or rales.  Abdominal:     General: Bowel sounds are normal. There is no distension.     Palpations: Abdomen is soft.     Tenderness: There is no abdominal tenderness. There is no rebound.  Musculoskeletal:        General: No tenderness. Normal range of motion.     Cervical back: Normal range of motion and neck  supple.  Skin:    General: Skin is warm and dry.     Findings: No abrasion or rash.  Neurological:     Mental Status: She is alert and oriented to person, place, and time.     GCS: GCS eye subscore is 4. GCS verbal subscore is 5. GCS motor subscore is 6.     Cranial Nerves: No cranial nerve deficit.     Sensory: No sensory deficit.  Psychiatric:        Speech: Speech normal.        Behavior: Behavior normal.     ED Results / Procedures / Treatments   Labs (all labs ordered are listed, but only abnormal results are displayed) Labs Reviewed  CBC WITH DIFFERENTIAL/PLATELET  BASIC METABOLIC PANEL  TROPONIN I (HIGH SENSITIVITY)    EKG None  Radiology No results found.  Procedures Procedures   Medications Ordered in ED Medications  0.9 %  sodium chloride infusion (has no administration in time range)    ED Course  I have  reviewed the triage vital signs and the nursing notes.  Pertinent labs & imaging results that were available during my care of the patient were reviewed by me and considered in my medical decision making (see chart for details).    MDM Rules/Calculators/A&P                          Spoke with an additional relative who states that there is some concern for possible aortic dissection.  Chest x-ray without findings here.  Patient was very hypertensive.  Will give medication for this in order CT angio chest  11:40 PM Patient's blood pressure noted here and given labetalol 5 mg IV push.  Concern for possible dissection and CT angio chest negative for that process.  Repeat blood pressure remains elevated.  Will give an additional dose labetalol here.  Will sign out to Dr. Preston Fleeting  CRITICAL CARE Performed by: Toy Baker Total critical care time: 55 minutes Critical care time was exclusive of separately billable procedures and treating other patients. Critical care was necessary to treat or prevent imminent or life-threatening deterioration. Critical care was time spent personally by me on the following activities: development of treatment plan with patient and/or surrogate as well as nursing, discussions with consultants, evaluation of patient's response to treatment, examination of patient, obtaining history from patient or surrogate, ordering and performing treatments and interventions, ordering and review of laboratory studies, ordering and review of radiographic studies, pulse oximetry and re-evaluation of patient's condition. Final Clinical Impression(s) / ED Diagnoses Final diagnoses:  Chest pain    Rx / DC Orders ED Discharge Orders    None       Lorre Nick, MD 06/03/20 2340

## 2020-06-03 NOTE — ED Triage Notes (Addendum)
Patient c/o left arm pain since 1400 today. Patient states her BP was up when she went to her PCP today and states she was given a med to bring her BP lower. Patient also reports that she was told there was something wrong with her aorta.  Patient reports that she is not currently having pain in her left arm.

## 2020-06-03 NOTE — Discharge Instructions (Signed)
Call your doctor in the morning for blood pressure management

## 2020-06-03 NOTE — ED Provider Notes (Signed)
Care assumed from Dr. Freida Busman, patient with work-up complete, but blood pressure elevated waiting for response to antihypertensives.  Blood pressure is down to 165/91.  She is felt to be safe for discharge.  She has close follow-up already arranged.   Dione Booze, MD 06/04/20 [phone]

## 2020-07-11 ENCOUNTER — Encounter (INDEPENDENT_AMBULATORY_CARE_PROVIDER_SITE_OTHER): Payer: Self-pay | Admitting: Ophthalmology

## 2020-07-11 ENCOUNTER — Other Ambulatory Visit: Payer: Self-pay

## 2020-07-11 ENCOUNTER — Ambulatory Visit (INDEPENDENT_AMBULATORY_CARE_PROVIDER_SITE_OTHER): Payer: Medicare HMO | Admitting: Ophthalmology

## 2020-07-11 DIAGNOSIS — H3581 Retinal edema: Secondary | ICD-10-CM

## 2020-07-11 DIAGNOSIS — H35061 Retinal vasculitis, right eye: Secondary | ICD-10-CM

## 2020-07-11 DIAGNOSIS — I10 Essential (primary) hypertension: Secondary | ICD-10-CM | POA: Diagnosis not present

## 2020-07-11 DIAGNOSIS — H25813 Combined forms of age-related cataract, bilateral: Secondary | ICD-10-CM

## 2020-07-11 DIAGNOSIS — H35021 Exudative retinopathy, right eye: Secondary | ICD-10-CM

## 2020-07-11 DIAGNOSIS — H35033 Hypertensive retinopathy, bilateral: Secondary | ICD-10-CM

## 2020-07-11 NOTE — Progress Notes (Signed)
[facility] - Clinic Note  07/11/2020     CHIEF COMPLAINT Patient presents for Retina Evaluation   HISTORY OF PRESENT ILLNESS: Chelsey Carpenter is a 75 y.o. female who presents to the clinic today for:   HPI     Retina Evaluation   In right eye.  Associated Symptoms Floaters.  I, the attending physician,  performed the HPI with the patient and updated documentation appropriately.        Comments   Patient here for Retina Evaluation. Referred by Dr Dion Body. Patient states vision sees real good. No eye pain. Has dry eyes.      Last edited by Rennis Chris, MD on 07/11/2020 11:14 PM.    Pt is here on the referral of Dr. Dion Body for concern of peripheral hemes, pt states she saw him for a routine eye exam, pt denies any previous eye hx other than wearing glasses, she denies any eye sx, pt states her eyes are very dry and she has been seeing floaters in her right eye, pt states her mother has glaucoma, she is unaware of any significant birth hx, she denies being diabetic, but does endorse being hypertensive  Referring physician: Blair Promise, OD [address] [place],  [place] [address]  HISTORICAL INFORMATION:   Selected notes from the MEDICAL RECORD NUMBER Referred by Dr. Dion Body for retinal hemes LEE:  Ocular Hx- PMH-    CURRENT MEDICATIONS: No current outpatient medications on file. (Ophthalmic Drugs)   No current facility-administered medications for this visit. (Ophthalmic Drugs)   Current Outpatient Medications (Other)  Medication Sig   lisinopril (ZESTRIL) 30 MG tablet Take 30 mg by mouth daily.   Multiple Vitamins-Minerals (MULTIVITAMIN WOMEN 50+) TABS Take 1 tablet by mouth daily.   ST JOSEPH LOW DOSE 81 MG EC tablet Take 81 mg by mouth daily. Swallow whole.   No current facility-administered medications for this visit. (Other)      REVIEW OF SYSTEMS: ROS   Positive for: Eyes Negative for: Constitutional, Gastrointestinal,  Neurological, Skin, Genitourinary, Musculoskeletal, HENT, Endocrine, Cardiovascular, Respiratory, Psychiatric, Allergic/Imm, Heme/Lymph Last edited by Joni Reining, COA on 07/11/2020  8:47 AM.       ALLERGIES Allergies  Allergen Reactions   Amlodipine Other (See Comments)    Brought down B/P too fast   Other Other (See Comments)    Opioids- patient prefers to not take this class of meds  Seasonal allergies- Runny nose, itchy eyes, post-nasal drip   Peanut-Containing Drug Products Nausea Only and Other (See Comments)    Made the patient sick to her stomach    PAST MEDICAL HISTORY Past Medical History:  Diagnosis Date   Cataract    Hypertension    Past Surgical History:  Procedure Laterality Date   ABDOMINAL HYSTERECTOMY     CYSTECTOMY     TONSILLECTOMY      FAMILY HISTORY History reviewed. No pertinent family history.  SOCIAL HISTORY Social History   Tobacco Use   Smoking status: Every Day    Pack years: 0.00   Smokeless tobacco: Never  Vaping Use   Vaping Use: Never used  Substance Use Topics   Alcohol use: Never   Drug use: Never         OPHTHALMIC EXAM:  Base Eye Exam     Visual Acuity (Snellen - Linear)       Right Left   Dist cc 20/50 -2 20/30 -2   Dist ph cc 20/40 -2 NI  Correction: Glasses         Tonometry (Tonopen, 8:32 AM)       Right Left   Pressure 21 18         Pupils       Dark Light Shape React APD   Right 3 2 Round Brisk None   Left 3 2 Round Brisk None         Visual Fields (Counting fingers)       Left Right    Full Full         Extraocular Movement       Right Left    Full, Ortho Full, Ortho         Neuro/Psych     Oriented x3: Yes   Mood/Affect: Normal         Dilation     Both eyes: 1.0% Mydriacyl, 2.5% Phenylephrine @ 8:32 AM           Slit Lamp and Fundus Exam     Slit Lamp Exam       Right Left   Lids/Lashes Dermatochalasis - upper lid Dermatochalasis - upper lid    Conjunctiva/Sclera Melanosis, nasal pingeucula Melanosis, nasal pingeucula   Cornea 2+ inferior Punctate epithelial erosions 2+ inferior Punctate epithelial erosions   Anterior Chamber Deep and quiet Deep and quiet   Iris Round and dilated, No NVI Round and dilated, No NVI   Lens 2-3+ Nuclear sclerosis, 2-3+ Cortical cataract 2-3+ Nuclear sclerosis, 2-3+ Cortical cataract   Vitreous Vitreous syneresis, Posterior vitreous detachment Vitreous syneresis         Fundus Exam       Right Left   Disc mild Pallor, Sharp rim mild Pallor, Sharp rim   C/D Ratio 0.2 0.4   Macula Flat, Blunted foveal reflex, RPE mottling and clumping, +IRH and atrophy temporal macula Flat, Good foveal reflex, RPE mottling, No heme or edema   Vessels attenuated, Tortuous, mild Copper wiring, +Telangiectasia temporal periphery, peripheral sclerosis temporally attenuated, mild tortuousity   Periphery Attached, peripheral ischemia and IRH temporal periphery    Attached           Refraction     Wearing Rx       Sphere Cylinder Axis   Right +1.00 +1.00 002   Left +1.00 +1.25 172  Has separate readers.        Manifest Refraction       Sphere Cylinder Axis Dist VA   Right +1.00 +0.75 150 NI   Left +1.00 +150.00 180 NI            IMAGING AND PROCEDURES  Imaging and Procedures for 07/11/2020  OCT, Retina - OU - Both Eyes       Right Eye Quality was good. Central Foveal Thickness: 263. Progression has no prior data. Findings include normal foveal contour, no IRF, no SRF (Mild inner retinal irregularity and thinning temporal macula).   Left Eye Quality was good. Central Foveal Thickness: 265. Progression has no prior data. Findings include normal foveal contour, no IRF, no SRF.   Notes *Images captured and stored on drive  Diagnosis / Impression:  NFP; no IRF/SRF OU  Clinical management:  See below  Abbreviations: NFP - Normal foveal profile. CME - cystoid macular edema. PED - pigment  epithelial detachment. IRF - intraretinal fluid. SRF - subretinal fluid. EZ - ellipsoid zone. ERM - epiretinal membrane. ORA - outer retinal atrophy. ORT - outer retinal tubulation. SRHM - subretinal hyper-reflective material. IRHM -  intraretinal hyper-reflective material      Fluorescein Angiography Optos (Transit OD)       Right Eye Progression has no prior data. Early phase findings include vascular perfusion defect, leakage. Mid/Late phase findings include vascular perfusion defect, leakage, retinal neovascularization (Focal peripheral vascular non perfusion temporal periphery with focal leaking telangectasia v early NVE).   Left Eye Progression has no prior data. Early phase findings include normal observations. Mid/Late phase findings include normal observations.   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 Retinal edema  H35.81 OCT, Retina - OU - Both Eyes    4. Essential hypertension  I10     5. Hypertensive retinopathy of both eyes  H35.033 Fluorescein Angiography Optos (Transit OD)    6. Combined forms of age-related cataract of both eyes  H25.813       1-3. Eales vs Coats' Disease OD  - focal nonperfusion w/ sclerotic vessels, telangectasis, and terminal bulbs in temporal periphery on exam and FA  - FA shows focal patch of capillary drop out / vascular nonperfusion w/ leaking terminal bulbs OD  - pt is asymptomatic  - BCVA 20/40, mostly due to dry eyes  - discussed retinal findings  - recommend non-urgent segmental PRP OD to areas of vascular nonperfusion, temporal periphery  - f/u Tuesday, July 12 for PRP OD  4,5. Hypertensive retinopathy  OU - discussed importance of tight BP control - monitor  6. Mixed Cataract OU - The symptoms of cataract, surgical options, and treatments and risks were discussed with patient. - discussed diagnosis and progression - monitor   Ophthalmic Meds Ordered this visit:  No orders of the defined types were placed in this encounter.      Return for f/u Tuesday, July 12 at 245, eales disease OD, PRP OD.  There are no Patient Instructions on file for this visit.   Explained the diagnoses, plan, and follow up with the patient and they expressed understanding.  Patient expressed understanding of the importance of proper follow up care.   This document serves as a record of services personally performed by Karie Chimera, MD, PhD. It was created on their behalf by Glee Arvin. Manson Passey, OA an ophthalmic technician. The creation of this record is the provider's dictation and/or activities during the visit.    Electronically signed by: Glee Arvin. Manson Passey, [place] 07.01.2022 11:29 PM  Karie Chimera, M.D., Ph.D. Diseases & Surgery of the Retina and Vitreous [facility]  I have reviewed the above documentation for accuracy and completeness, and I agree with the above. Karie Chimera, M.D., Ph.D. 07/11/20 11:29 PM  Abbreviations: M myopia (nearsighted); A astigmatism; H hyperopia (farsighted); P presbyopia; Mrx spectacle prescription;  CTL contact lenses; OD right eye; OS left eye; OU both eyes  XT exotropia; ET esotropia; PEK punctate epithelial keratitis; PEE punctate epithelial erosions; DES dry eye syndrome; MGD meibomian gland dysfunction; ATs artificial tears; PFAT's preservative free artificial tears; NSC nuclear sclerotic cataract; PSC posterior subcapsular cataract; ERM epi-retinal membrane; PVD posterior vitreous detachment; RD retinal detachment; DM diabetes mellitus;  DR diabetic retinopathy; NPDR non-proliferative diabetic retinopathy; PDR proliferative diabetic retinopathy;  CSME clinically significant macular edema; DME diabetic macular edema; dbh dot blot hemorrhages; CWS cotton wool spot; POAG primary open angle glaucoma; C/D cup-to-disc ratio; HVF humphrey visual field; GVF goldmann visual field; OCT optical coherence tomography; IOP intraocular pressure; BRVO Branch retinal vein occlusion; CRVO central retinal vein occlusion; CRAO central retinal artery occlusion; BRAO branch retinal artery occlusion; RT retinal tear; SB scleral buckle; PPV pars plana vitrectomy; VH Vitreous hemorrhage; PRP panretinal laser photocoagulation; IVK intravitreal kenalog; VMT vitreomacular traction; MH Macular hole;  NVD neovascularization of the disc; NVE neovascularization elsewhere; AREDS age related eye disease study; ARMD age related macular degeneration; POAG primary open angle glaucoma; EBMD epithelial/anterior basement membrane dystrophy; ACIOL anterior chamber intraocular lens; IOL intraocular lens; PCIOL posterior chamber intraocular lens; Phaco/IOL phacoemulsification with intraocular lens placement; PRK photorefractive keratectomy; LASIK laser assisted in situ keratomileusis; HTN hypertension; DM diabetes mellitus; COPD chronic obstructive pulmonary disease

## 2020-07-22 ENCOUNTER — Encounter (INDEPENDENT_AMBULATORY_CARE_PROVIDER_SITE_OTHER): Payer: Self-pay

## 2020-07-22 ENCOUNTER — Encounter (INDEPENDENT_AMBULATORY_CARE_PROVIDER_SITE_OTHER): Payer: Medicare HMO | Admitting: Ophthalmology

## 2020-08-25 ENCOUNTER — Encounter (INDEPENDENT_AMBULATORY_CARE_PROVIDER_SITE_OTHER): Payer: Medicare HMO | Admitting: Ophthalmology

## 2020-08-25 ENCOUNTER — Encounter (INDEPENDENT_AMBULATORY_CARE_PROVIDER_SITE_OTHER): Payer: Self-pay

## 2020-08-25 DIAGNOSIS — I10 Essential (primary) hypertension: Secondary | ICD-10-CM

## 2020-08-25 DIAGNOSIS — H35033 Hypertensive retinopathy, bilateral: Secondary | ICD-10-CM

## 2020-08-25 DIAGNOSIS — H3581 Retinal edema: Secondary | ICD-10-CM

## 2020-08-25 DIAGNOSIS — H35061 Retinal vasculitis, right eye: Secondary | ICD-10-CM

## 2020-08-25 DIAGNOSIS — H35021 Exudative retinopathy, right eye: Secondary | ICD-10-CM

## 2020-08-25 DIAGNOSIS — H25813 Combined forms of age-related cataract, bilateral: Secondary | ICD-10-CM

## 2020-10-02 NOTE — Progress Notes (Signed)
[facility] - Clinic Note  10/06/2020    d CHIEF COMPLAINT Patient presents for Retina Follow Up   HISTORY OF PRESENT ILLNESS: Chelsey Carpenter is a 75 y.o. female who presents to the clinic today for:   HPI     Retina Follow Up   Patient presents with  Other.  In right eye.  This started months ago.  Severity is moderate.  Duration of 2.5 months.  Since onset it is stable.  I, the attending physician,  performed the HPI with the patient and updated documentation appropriately.        Comments   75 y/o female pt here for 3 mo (delayed) f/u for Eales vs. Coats' Disease OD.  No change in [place] OU.  Denies pain, FOL, new floaters.  AT prn OU.      Last edited by Rennis Chris, MD on 10/06/2020 11:56 PM.     Pt states she had a virus is the reason why she missed her last appt.   Referring physician: Blair Promise, OD [address] [place],  [place] [address]  HISTORICAL INFORMATION:   Selected notes from the MEDICAL RECORD NUMBER Referred by Dr. Dion Body for retinal hemes LEE:  Ocular Hx- PMH-    CURRENT MEDICATIONS: No current outpatient medications on file. (Ophthalmic Drugs)   No current facility-administered medications for this visit. (Ophthalmic Drugs)   Current Outpatient Medications (Other)  Medication Sig   atorvastatin (LIPITOR) 40 MG tablet Take 40 mg by mouth daily.   chlorthalidone (HYGROTON) 25 MG tablet Take by mouth.   cloNIDine (CATAPRES) 0.1 MG tablet Take 0.1 mg by mouth 2 (two) times daily.   ezetimibe (ZETIA) 10 MG tablet Take 10 mg by mouth daily.   hydrochlorothiazide (HYDRODIURIL) 12.5 MG tablet Take 12.5 mg by mouth daily.   lisinopril (ZESTRIL) 30 MG tablet Take 30 mg by mouth daily.   lisinopril-hydrochlorothiazide (ZESTORETIC) 20-12.5 MG tablet Take by mouth.   Multiple Vitamin (MULTI-VITAMIN) tablet Take 1 tablet by mouth daily.   Multiple Vitamins-Minerals (MULTIVITAMIN WOMEN 50+) TABS Take 1 tablet by mouth daily.    potassium chloride (MICRO-K) 10 MEQ CR capsule Take by mouth.   ST JOSEPH LOW DOSE 81 MG EC tablet Take 81 mg by mouth daily. Swallow whole.   telmisartan (MICARDIS) 40 MG tablet Take 40 mg by mouth daily.   lisinopril (ZESTRIL) 20 MG tablet Take by mouth. (Patient not taking: Reported on 10/06/2020)   No current facility-administered medications for this visit. (Other)    REVIEW OF SYSTEMS: ROS   Positive for: Eyes Negative for: Constitutional, Gastrointestinal, Neurological, Skin, Genitourinary, Musculoskeletal, HENT, Endocrine, Cardiovascular, Respiratory, Psychiatric, Allergic/Imm, Heme/Lymph Last edited by Celine Mans, COA on 10/06/2020  1:22 PM.      ALLERGIES Allergies  Allergen Reactions   Amlodipine Other (See Comments)    Brought down B/P too fast   Other Other (See Comments)    Opioids- patient prefers to not take this class of meds  Seasonal allergies- Runny nose, itchy eyes, post-nasal drip   Peanut-Containing Drug Products Nausea Only and Other (See Comments)    Made the patient sick to her stomach    PAST MEDICAL HISTORY Past Medical History:  Diagnosis Date   Cataract    Hypertension    Hypertensive retinopathy    Past Surgical History:  Procedure Laterality Date   ABDOMINAL HYSTERECTOMY     CYSTECTOMY     TONSILLECTOMY      FAMILY HISTORY History  reviewed. No pertinent family history.  SOCIAL HISTORY Social History   Tobacco Use   Smoking status: Every Day   Smokeless tobacco: Never  Vaping Use   Vaping Use: Never used  Substance Use Topics   Alcohol use: Never   Drug use: Never         OPHTHALMIC EXAM:  Base Eye Exam     Visual Acuity (Snellen - Linear)       Right Left   Dist cc 20/50 -2 20/40 -2   Dist ph cc 20/40 -2 20/30 -2         Tonometry (Tonopen, 1:39 PM)       Right Left   Pressure 18 17         Pupils       Dark Light Shape React APD   Right 3 2 Round Brisk None   Left 3 2 Round Brisk None          Visual Fields (Counting fingers)       Left Right     Full         Extraocular Movement       Right Left    Full, Ortho Full, Ortho         Neuro/Psych     Oriented x3: Yes   Mood/Affect: Normal         Dilation     Both eyes: 1.0% Mydriacyl, 2.5% Phenylephrine @ 1:39 PM           Slit Lamp and Fundus Exam     Slit Lamp Exam       Right Left   Lids/Lashes Dermatochalasis - upper lid Dermatochalasis - upper lid   Conjunctiva/Sclera Melanosis, nasal pingeucula Melanosis, nasal pingeucula   Cornea trace inferior Punctate epithelial erosions, , Debris in tear film Trace inferior Punctate epithelial erosions   Anterior Chamber Deep and quiet Deep and quiet   Iris Round and dilated, No NVI Round and dilated, No NVI   Lens 2-3+ Nuclear sclerosis, 2-3+ Cortical cataract 2-3+ Nuclear sclerosis, 2-3+ Cortical cataract   Vitreous Vitreous syneresis, Posterior vitreous detachment Vitreous syneresis         Fundus Exam       Right Left   Disc mild Pallor, Sharp rim mild Pallor, Sharp rim   C/D Ratio 0.2 0.4   Macula Flat, Blunted foveal reflex, RPE mottling and clumping, + atrophy, +IRH improving  Flat, Good foveal reflex, RPE mottling, No heme or edema   Vessels attenuated, Tortuous, mild Copper wiring, +Telangiectasia temporal periphery, peripheral sclerosis temporally, AV crossing changes attenuated, mild tortuousity   Periphery Attached, peripheral ischemia and IRH temporal periphery    Attached, mild Peripheral drusen           Refraction     Wearing Rx       Sphere Cylinder Axis   Right +1.00 +1.00 002   Left +1.00 +1.25 172            IMAGING AND PROCEDURES  Imaging and Procedures for 10/06/2020  OCT, Retina - OU - Both Eyes       Right Eye Quality was good. Central Foveal Thickness: 258. Progression has been stable. Findings include normal foveal contour, no IRF, no SRF, outer retinal atrophy (Diffuse thinning temporal periphery  caught on widefield. ).   Left Eye Quality was good. Central Foveal Thickness: 260. Progression has been stable. Findings include normal foveal contour, no IRF, no SRF.   Notes *Images captured and  stored on drive  Diagnosis / Impression:  OD: Diffuse thinning temporal periphery caught on widefield. OS: NFP; no IRF/SRF   Clinical management:  See below  Abbreviations: NFP - Normal foveal profile. CME - cystoid macular edema. PED - pigment epithelial detachment. IRF - intraretinal fluid. SRF - subretinal fluid. EZ - ellipsoid zone. ERM - epiretinal membrane. ORA - outer retinal atrophy. ORT - outer retinal tubulation. SRHM - subretinal hyper-reflective material. 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 Retinal edema  H35.81 OCT, Retina - OU - Both Eyes    4. Essential hypertension  I10     5. Combined forms of age-related cataract of both eyes  H25.813     6. Hypertensive retinopathy of both eyes  H35.033       1-3. Eales vs Coats' Disease OD  - lost to f/u since initial visit on 7.1.22 (2+ mos) due to illness  - focal nonperfusion w/ sclerotic vessels, telangectasis, and terminal bulbs in temporal periphery on exam and FA  - FA 7.1.22 shows focal patch of capillary drop out / vascular nonperfusion w/ leaking terminal bulbs OD  - pt is asymptomatic  - BCVA 20/40 -- stable  - discussed retinal findings  - recommend non-urgent segmental PRP OD to areas of vascular nonperfusion, temporal periphery  - f/u 1-2 week DFE, OCT, segmental PRP OD  4,5. Hypertensive retinopathy OU - discussed importance of tight BP control - monitor  6. Mixed Cataract OU - The symptoms of cataract, surgical options, and treatments and risks were discussed with patient. - discussed diagnosis and progression - monitor   Ophthalmic Meds Ordered  this visit:  No orders of the defined types were placed in this encounter.    Return in 2 weeks (on 10/20/2020) for Eales vs Coats Disease -  DFE, OCT, PRP OD.  There are no Patient Instructions on file for this visit.  This document serves as a record of services personally performed by Karie Chimera, MD, PhD. It was created on their behalf by Herby Abraham, COA, an ophthalmic technician. The creation of this record is the provider's dictation and/or activities during the visit.    Electronically signed by: Herby Abraham, COA @TODAY @ 12:00 AM  This document serves as a record of services personally performed by , MD, PhD. It was created on their behalf by Karie Chimera, an ophthalmic technician. The creation of this record is the provider's dictation and/or activities during the visit.    Electronically signed by: Joni Reining COA, 10/07/20  12:00 AM'  10/09/20, M.D., Ph.D. Diseases & Surgery of the Retina and Vitreous [facility] 10/06/2020   I have reviewed the above documentation for accuracy and completeness, and I agree with the above. 10/08/2020, M.D., Ph.D. 10/07/20 12:00 AM   Abbreviations: M myopia (nearsighted); A astigmatism; H hyperopia (farsighted); P presbyopia; Mrx spectacle prescription;  CTL contact lenses; OD right eye; OS left eye; OU both eyes  XT exotropia; ET esotropia; PEK punctate epithelial keratitis; PEE punctate epithelial erosions; DES dry eye syndrome; MGD meibomian gland dysfunction; ATs artificial tears; PFAT's preservative free artificial tears; NSC nuclear sclerotic cataract; PSC posterior subcapsular cataract; ERM epi-retinal membrane; PVD posterior vitreous detachment;  RD retinal detachment; DM diabetes mellitus; DR diabetic retinopathy; NPDR non-proliferative diabetic retinopathy; PDR proliferative diabetic retinopathy; CSME clinically significant macular edema; DME diabetic macular edema; dbh dot blot  hemorrhages; CWS cotton wool spot; POAG primary open angle glaucoma; C/D cup-to-disc ratio; HVF humphrey visual field; GVF goldmann visual field; OCT optical coherence tomography; IOP intraocular pressure; BRVO Branch retinal vein occlusion; CRVO central retinal vein occlusion; CRAO central retinal artery occlusion; BRAO branch retinal artery occlusion; RT retinal tear; SB scleral buckle; PPV pars plana vitrectomy; VH Vitreous hemorrhage; PRP panretinal laser photocoagulation; IVK intravitreal kenalog; VMT vitreomacular traction; MH Macular hole;  NVD neovascularization of the disc; NVE neovascularization elsewhere; AREDS age related eye disease study; ARMD age related macular degeneration; POAG primary open angle glaucoma; EBMD epithelial/anterior basement membrane dystrophy; ACIOL anterior chamber intraocular lens; IOL intraocular lens; PCIOL posterior chamber intraocular lens; Phaco/IOL phacoemulsification with intraocular lens placement; PRK photorefractive keratectomy; LASIK laser assisted in situ keratomileusis; HTN hypertension; DM diabetes mellitus; COPD chronic obstructive pulmonary disease

## 2020-10-06 ENCOUNTER — Other Ambulatory Visit: Payer: Self-pay

## 2020-10-06 ENCOUNTER — Encounter (INDEPENDENT_AMBULATORY_CARE_PROVIDER_SITE_OTHER): Payer: Self-pay | Admitting: Ophthalmology

## 2020-10-06 ENCOUNTER — Ambulatory Visit (INDEPENDENT_AMBULATORY_CARE_PROVIDER_SITE_OTHER): Payer: Medicare HMO | Admitting: Ophthalmology

## 2020-10-06 DIAGNOSIS — H35061 Retinal vasculitis, right eye: Secondary | ICD-10-CM

## 2020-10-06 DIAGNOSIS — H35033 Hypertensive retinopathy, bilateral: Secondary | ICD-10-CM

## 2020-10-06 DIAGNOSIS — H3581 Retinal edema: Secondary | ICD-10-CM

## 2020-10-06 DIAGNOSIS — H25813 Combined forms of age-related cataract, bilateral: Secondary | ICD-10-CM | POA: Diagnosis not present

## 2020-10-06 DIAGNOSIS — I10 Essential (primary) hypertension: Secondary | ICD-10-CM | POA: Diagnosis not present

## 2020-10-06 DIAGNOSIS — H35021 Exudative retinopathy, right eye: Secondary | ICD-10-CM | POA: Diagnosis not present

## 2020-10-14 NOTE — Progress Notes (Signed)
[facility] Note  10/20/2020    d CHIEF COMPLAINT Patient presents for Retina Follow Up   HISTORY OF PRESENT ILLNESS: Chelsey Carpenter is a 75 y.o. female who presents to the clinic today for:   HPI     Retina Follow Up   Patient presents with  Other.  In right eye.  Severity is mild.  Duration of 2 weeks.  Since onset it is stable.  I, the attending physician,  performed the HPI with the patient and updated documentation appropriately.        Comments   Pt here for 2 wk ret f/u for PRP OD. Pt states vision is the same, no changes.       Last edited by Bernarda Caffey, MD on 10/20/2020  5:16 PM.     Referring physician: Dulce Sellar, MD [place],  [place] [address]  HISTORICAL INFORMATION:  Selected notes from the MEDICAL RECORD NUMBER Referred by Dr. Rick Duff for retinal hemes LEE:  Ocular Hx- PMH-    CURRENT MEDICATIONS: Current Outpatient Medications (Ophthalmic Drugs)  Medication Sig   prednisoLONE acetate (PRED FORTE) 1 % ophthalmic suspension Place 1 drop into the right eye 4 (four) times daily for 7 days.   No current facility-administered medications for this visit. (Ophthalmic Drugs)   Current Outpatient Medications (Other)  Medication Sig   atorvastatin (LIPITOR) 40 MG tablet Take 40 mg by mouth daily.   chlorthalidone (HYGROTON) 25 MG tablet Take by mouth.   cloNIDine (CATAPRES) 0.1 MG tablet Take 0.1 mg by mouth 2 (two) times daily.   ezetimibe (ZETIA) 10 MG tablet Take 10 mg by mouth daily.   hydrochlorothiazide (HYDRODIURIL) 12.5 MG tablet Take 12.5 mg by mouth daily.   lisinopril (ZESTRIL) 20 MG tablet Take by mouth. (Patient not taking: Reported on 10/06/2020)   lisinopril (ZESTRIL) 30 MG tablet Take 30 mg by mouth daily.   lisinopril-hydrochlorothiazide (ZESTORETIC) 20-12.5 MG tablet Take by mouth.   Multiple Vitamin (MULTI-VITAMIN) tablet Take 1 tablet by mouth daily.   Multiple  Vitamins-Minerals (MULTIVITAMIN WOMEN 50+) TABS Take 1 tablet by mouth daily.   potassium chloride (MICRO-K) 10 MEQ CR capsule Take by mouth.   ST JOSEPH LOW DOSE 81 MG EC tablet Take 81 mg by mouth daily. Swallow whole.   telmisartan (MICARDIS) 40 MG tablet Take 40 mg by mouth daily.   No current facility-administered medications for this visit. (Other)   REVIEW OF SYSTEMS: ROS   Positive for: Eyes Negative for: Constitutional, Gastrointestinal, Neurological, Skin, Genitourinary, Musculoskeletal, HENT, Endocrine, Cardiovascular, Respiratory, Psychiatric, Allergic/Imm, Heme/Lymph Last edited by Kingsley Spittle, COT on 10/20/2020  3:01 PM.    ALLERGIES Allergies  Allergen Reactions   Amlodipine Other (See Comments)    Brought down B/P too fast   Other Other (See Comments)    Opioids- patient prefers to not take this class of meds  Seasonal allergies- Runny nose, itchy eyes, post-nasal drip   Peanut-Containing Drug Products Nausea Only and Other (See Comments)    Made the patient sick to her stomach    PAST MEDICAL HISTORY Past Medical History:  Diagnosis Date   Cataract    Hypertension    Hypertensive retinopathy    Past Surgical History:  Procedure Laterality Date   ABDOMINAL HYSTERECTOMY     CYSTECTOMY     TONSILLECTOMY      FAMILY HISTORY History reviewed. No pertinent family history.  SOCIAL HISTORY Social History   Tobacco  Use   Smoking status: Every Day   Smokeless tobacco: Never  Vaping Use   Vaping Use: Never used  Substance Use Topics   Alcohol use: Never   Drug use: Never       OPHTHALMIC EXAM:  Base Eye Exam     Visual Acuity (Snellen - Linear)       Right Left   Dist cc 20/60 +2 20/30 +2    Correction: Glasses  Pt left her distance glasses at home, only brought her readers today, used phoropter w/ rx on file. MS        Tonometry (Tonopen, 3:11 PM)       Right Left   Pressure 16 20         Pupils       Dark Light Shape  React APD   Right 3 2  Round Brisk None   Left 3 2 Round Brisk None         Visual Fields (Counting fingers)       Left Right    Full Full         Extraocular Movement       Right Left    Full, Ortho Full, Ortho         Neuro/Psych     Oriented x3: Yes   Mood/Affect: Normal         Dilation     Right eye: 1.0% Mydriacyl, 2.5% Phenylephrine @ 3:12 PM           Slit Lamp and Fundus Exam     Slit Lamp Exam       Right Left   Lids/Lashes Dermatochalasis - upper lid Dermatochalasis - upper lid   Conjunctiva/Sclera Melanosis, nasal pingeucula Melanosis, nasal pingeucula   Cornea trace inferior Punctate epithelial erosions, , Debris in tear film Trace inferior Punctate epithelial erosions   Anterior Chamber Deep and quiet Deep and quiet   Iris Round and dilated, No NVI Round and dilated, No NVI   Lens 2-3+ Nuclear sclerosis, 2-3+ Cortical cataract 2-3+ Nuclear sclerosis, 2-3+ Cortical cataract   Vitreous Vitreous syneresis, Posterior vitreous detachment Vitreous syneresis         Fundus Exam       Right Left   Disc mild Pallor, Sharp rim mild Pallor, Sharp rim   C/D Ratio 0.2 0.4   Macula Flat, Blunted foveal reflex, RPE mottling and clumping, + atrophy, +IRH improving  Flat, Good foveal reflex, RPE mottling, No heme or edema   Vessels attenuated, Tortuous, mild Copper wiring, +Telangiectasia temporal periphery, peripheral sclerosis temporally, AV crossing changes attenuated, mild tortuousity   Periphery Attached, peripheral ischemia and IRH temporal periphery    Attached, mild Peripheral drusen           Refraction     Wearing Rx       Sphere Cylinder Axis   Right +1.00 +1.00 002   Left +1.00 +1.25 172           IMAGING AND PROCEDURES  Imaging and Procedures for 10/20/2020  OCT, Retina - OU - Both Eyes       Right Eye Quality was good. Central Foveal Thickness: 256. Progression has been stable. Findings include normal foveal contour,  no IRF, no SRF, outer retinal atrophy (Diffuse thinning temporal periphery caught on widefield. ).   Left Eye Quality was good. Central Foveal Thickness: 261. Progression has been stable. Findings include normal foveal contour, no IRF, no SRF.   Notes *Images captured and  stored on drive  Diagnosis / Impression:  OD: Diffuse thinning temporal periphery caught on widefield. OS: NFP; no IRF/SRF   Clinical management:  See below  Abbreviations: NFP - Normal foveal profile. CME - cystoid macular edema. PED - pigment epithelial detachment. IRF - intraretinal fluid. SRF - subretinal fluid. EZ - ellipsoid zone. ERM - epiretinal membrane. ORA - outer retinal atrophy. ORT - outer retinal tubulation. SRHM - subretinal hyper-reflective material. IRHM - intraretinal hyper-reflective material      Panretinal Photocoagulation - OD - Right Eye       LASER PROCEDURE NOTE  Diagnosis:   Proliferative Retinopathy, Eales Disease, RIGHT EYE  Procedure:  Segmental pan-retinal photocoagulation using slit lamp laser, RIGHT EYE  Anesthesia:  Topical  Surgeon: Bernarda Caffey, MD, PhD   Informed consent obtained, operative eye marked, and time out performed prior to initiation of laser.   Lumenis Smart532 slit lamp laser Pattern: 2x2 square, 3x3 square Power: 260 mW Duration: 30 msec  Spot size: 200 microns  # spots: 589 spots temporal periphery  Complications: None.  Notes: cortical cataract obscuring peripheral view and preventing laser up take in scattered focal areas  RTC: 4-6 wks -- DFE/OCT  Patient tolerated the procedure well and received written and verbal post-procedure care information/education.            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 Proliferative retinopathy of right eye  H35.21 Panretinal  Photocoagulation - OD - Right Eye    4. Retinal edema  H35.81 OCT, Retina - OU - Both Eyes    5. Essential hypertension  I10     6. Hypertensive retinopathy of both eyes  H35.033     7. Combined forms of age-related cataract of both eyes  H25.813      1-4. Eales vs Coats' Disease w/ proliferative retinopathy OD  - lost to f/u since initial visit on 7.1.22 (2+ mos) due to illness  - focal nonperfusion w/ sclerotic vessels, telangectasis, and terminal bulbs in temporal periphery on exam and FA  - FA 7.1.22 shows focal patch of capillary drop out / vascular nonperfusion w/ leaking terminal bulbs OD  - pt is asymptomatic  - BCVA 20/60 -- decreased  - discussed retinal findings  - recommend segmental PRP OD to areas of vascular nonperfusion, temporal periphery today, 10.10.22  - pt wishes to proceed with laser  - RBA of procedure discussed, questions answered - informed consent obtained and signed - see procedure note - start PF QID OD x7 days  - f/u 4-6 weeks DFE, OCT  5,6. Hypertensive retinopathy OU - discussed importance of tight BP control - monitor  7. Mixed Cataract OU - The symptoms of cataract, surgical options, and treatments and risks were discussed with patient. - discussed diagnosis and progression - monitor  Ophthalmic Meds Ordered this visit:  Meds ordered this encounter  Medications   prednisoLONE acetate (PRED FORTE) 1 % ophthalmic suspension    Sig: Place 1 drop into the right eye 4 (four) times daily for 7 days.    Dispense:  10 mL    Refill:  0     Return for f/u 4-6 weeks, Eales disease OD, DFE, OCT.  There are no Patient Instructions on file for this visit.  This document serves  as a record of services personally performed by Gardiner Sleeper, MD, PhD. It was created on their behalf by Leeann Must, [place], an ophthalmic technician. The creation of this record is the provider's dictation and/or activities during the visit.    Electronically signed by:  Leeann Must, COA '@TODAY' @ 5:24 PM  This document serves as a record of services personally performed by Gardiner Sleeper, MD, PhD. It was created on their behalf by San Jetty. Owens Shark, OA an ophthalmic technician. The creation of this record is the provider's dictation and/or activities during the visit.    Electronically signed by: San Jetty. Owens Shark, [place] 10.10.2022 5:24 PM  Gardiner Sleeper, M.D., Ph.D. Diseases & Surgery of the Retina and [place] 10/20/2020    I have reviewed the above documentation for accuracy and completeness, and I agree with the above. Gardiner Sleeper, M.D., Ph.D. 10/20/20 5:24 PM   Abbreviations: M myopia (nearsighted); A astigmatism; H hyperopia (farsighted); P presbyopia; Mrx spectacle prescription;  CTL contact lenses; OD right eye; OS left eye; OU both eyes  XT exotropia; ET esotropia; PEK punctate epithelial keratitis; PEE punctate epithelial erosions; DES dry eye syndrome; MGD meibomian gland dysfunction; ATs artificial tears; PFAT's preservative free artificial tears; [place] nuclear sclerotic cataract; PSC posterior subcapsular cataract; ERM epi-retinal membrane; PVD posterior vitreous detachment; RD retinal detachment; DM diabetes mellitus; DR diabetic retinopathy; NPDR non-proliferative diabetic retinopathy; PDR proliferative diabetic retinopathy; CSME clinically significant macular edema; DME diabetic macular edema; dbh dot blot hemorrhages; CWS cotton wool spot; POAG primary open angle glaucoma; C/D cup-to-disc ratio; HVF humphrey visual field; GVF goldmann visual field; OCT optical coherence tomography; IOP intraocular pressure; BRVO Branch retinal vein occlusion; CRVO central retinal vein occlusion; CRAO central retinal artery occlusion; BRAO branch retinal artery occlusion; RT retinal tear; SB scleral buckle; PPV pars plana vitrectomy; VH Vitreous hemorrhage; PRP panretinal laser photocoagulation; IVK intravitreal kenalog; VMT  vitreomacular traction; MH Macular hole;  NVD neovascularization of the disc; NVE neovascularization elsewhere; AREDS age related eye disease study; ARMD age related macular degeneration; POAG primary open angle glaucoma; EBMD epithelial/anterior basement membrane dystrophy; ACIOL anterior chamber intraocular lens; IOL intraocular lens; PCIOL posterior chamber intraocular lens; Phaco/IOL phacoemulsification with intraocular lens placement; [place] photorefractive keratectomy; LASIK laser assisted in situ keratomileusis; HTN hypertension; DM diabetes mellitus; COPD chronic obstructive pulmonary disease

## 2020-10-20 ENCOUNTER — Ambulatory Visit (INDEPENDENT_AMBULATORY_CARE_PROVIDER_SITE_OTHER): Payer: Medicare HMO | Admitting: Ophthalmology

## 2020-10-20 ENCOUNTER — Encounter (INDEPENDENT_AMBULATORY_CARE_PROVIDER_SITE_OTHER): Payer: Self-pay | Admitting: Ophthalmology

## 2020-10-20 ENCOUNTER — Other Ambulatory Visit: Payer: Self-pay

## 2020-10-20 DIAGNOSIS — I10 Essential (primary) hypertension: Secondary | ICD-10-CM

## 2020-10-20 DIAGNOSIS — H35061 Retinal vasculitis, right eye: Secondary | ICD-10-CM

## 2020-10-20 DIAGNOSIS — H35021 Exudative retinopathy, right eye: Secondary | ICD-10-CM | POA: Diagnosis not present

## 2020-10-20 DIAGNOSIS — H25813 Combined forms of age-related cataract, bilateral: Secondary | ICD-10-CM

## 2020-10-20 DIAGNOSIS — H35033 Hypertensive retinopathy, bilateral: Secondary | ICD-10-CM

## 2020-10-20 DIAGNOSIS — H3581 Retinal edema: Secondary | ICD-10-CM

## 2020-10-20 DIAGNOSIS — H3521 Other non-diabetic proliferative retinopathy, right eye: Secondary | ICD-10-CM

## 2020-10-20 MED ORDER — PREDNISOLONE ACETATE 1 % OP SUSP: 1.0000 [drp] | Freq: Four times a day (QID) | OPHTHALMIC | 0 refills | Status: AC

## 2020-11-13 NOTE — Progress Notes (Shared)
[facility] Clinic Note  11/17/2020    d CHIEF COMPLAINT Patient presents for No chief complaint on file.   HISTORY OF PRESENT ILLNESS: Chelsey Carpenter is a 75 y.o. female who presents to the clinic today for:     Referring physician: Eliezer Lofts, MD [phone] [place],  [place] [address]  HISTORICAL INFORMATION:  Selected notes from the MEDICAL RECORD NUMBER Referred by Dr. Dion Body for retinal hemes LEE:  Ocular Hx- PMH-    CURRENT MEDICATIONS: No current outpatient medications on file. (Ophthalmic Drugs)   No current facility-administered medications for this visit. (Ophthalmic Drugs)   Current Outpatient Medications (Other)  Medication Sig   atorvastatin (LIPITOR) 40 MG tablet Take 40 mg by mouth daily.   chlorthalidone (HYGROTON) 25 MG tablet Take by mouth.   cloNIDine (CATAPRES) 0.1 MG tablet Take 0.1 mg by mouth 2 (two) times daily.   ezetimibe (ZETIA) 10 MG tablet Take 10 mg by mouth daily.   hydrochlorothiazide (HYDRODIURIL) 12.5 MG tablet Take 12.5 mg by mouth daily.   lisinopril (ZESTRIL) 20 MG tablet Take by mouth. (Patient not taking: Reported on 10/06/2020)   lisinopril (ZESTRIL) 30 MG tablet Take 30 mg by mouth daily.   lisinopril-hydrochlorothiazide (ZESTORETIC) 20-12.5 MG tablet Take by mouth.   Multiple Vitamin (MULTI-VITAMIN) tablet Take 1 tablet by mouth daily.   Multiple Vitamins-Minerals (MULTIVITAMIN WOMEN 50+) TABS Take 1 tablet by mouth daily.   potassium chloride (MICRO-K) 10 MEQ CR capsule Take by mouth.   ST JOSEPH LOW DOSE 81 MG EC tablet Take 81 mg by mouth daily. Swallow whole.   telmisartan (MICARDIS) 40 MG tablet Take 40 mg by mouth daily.   No current facility-administered medications for this visit. (Other)   REVIEW OF SYSTEMS:  ALLERGIES Allergies  Allergen Reactions   Amlodipine Other (See Comments)    Brought down B/P too fast   Other Other (See Comments)    Opioids- patient prefers to  not take this class of meds  Seasonal allergies- Runny nose, itchy eyes, post-nasal drip   Peanut-Containing Drug Products Nausea Only and Other (See Comments)    Made the patient sick to her stomach    PAST MEDICAL HISTORY Past Medical History:  Diagnosis Date   Cataract    Hypertension    Hypertensive retinopathy    Past Surgical History:  Procedure Laterality Date   ABDOMINAL HYSTERECTOMY     CYSTECTOMY     TONSILLECTOMY      FAMILY HISTORY No family history on file.  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 Proliferative retinopathy of right eye  H35.21     4. Retinal edema  H35.81     5. Essential hypertension  I10     6. Hypertensive retinopathy of both eyes  H35.033     7. Combined forms of age-related cataract of both eyes  H25.813  1-4. Eales vs Coats' Disease w/ proliferative retinopathy OD             - s/p PRP OD (10.10.22)  - lost to f/u since initial visit on 7.1.22 (2+ mos) due to illness  - focal nonperfusion w/ sclerotic vessels, telangectasis, and terminal bulbs in temporal periphery on exam and FA  - FA 7.1.22 shows focal patch of capillary drop out / vascular nonperfusion w/ leaking terminal bulbs OD  - pt is asymptomatic  - BCVA 20/60 -- decreased  - discussed retinal findings  - f/u 4-6 weeks DFE, OCT  5,6. Hypertensive retinopathy OU - discussed importance of tight BP control - monitor  7. Mixed Cataract OU - The symptoms of cataract, surgical options, and treatments and risks were discussed with patient. - discussed  diagnosis and progression - monitor  Ophthalmic Meds Ordered this visit:  No orders of the defined types were placed in this encounter.    No follow-ups on file.  There are no Patient Instructions on file for this visit.  This document serves as a record of services personally performed by Karie Chimera, MD, PhD. It was created on their behalf by Herby Abraham, COA, an ophthalmic technician. The creation of this record is the provider's dictation and/or activities during the visit.    Electronically signed by: Herby Abraham, COA @TODAY @ 10:09 AM    Abbreviations: M myopia (nearsighted); A astigmatism; H hyperopia (farsighted); P presbyopia; Mrx spectacle prescription;  CTL contact lenses; OD right eye; OS left eye; OU both eyes  XT exotropia; ET esotropia; PEK punctate epithelial keratitis; PEE punctate epithelial erosions; DES dry eye syndrome; MGD meibomian gland dysfunction; ATs artificial tears; PFAT's preservative free artificial tears; NSC nuclear sclerotic cataract; PSC posterior subcapsular cataract; ERM epi-retinal membrane; PVD posterior vitreous detachment; RD retinal detachment; DM diabetes mellitus; DR diabetic retinopathy; NPDR non-proliferative diabetic retinopathy; PDR proliferative diabetic retinopathy; CSME clinically significant macular edema; DME diabetic macular edema; dbh dot blot hemorrhages; CWS cotton wool spot; POAG primary open angle glaucoma; C/D cup-to-disc ratio; HVF humphrey visual field; GVF goldmann visual field; OCT optical coherence tomography; IOP intraocular pressure; BRVO Branch retinal vein occlusion; CRVO central retinal vein occlusion; CRAO central retinal artery occlusion; BRAO branch retinal artery occlusion; RT retinal tear; SB scleral buckle; PPV pars plana vitrectomy; VH Vitreous hemorrhage; PRP panretinal laser photocoagulation; IVK intravitreal kenalog; VMT vitreomacular traction; MH Macular hole;  NVD neovascularization of the disc; NVE  neovascularization elsewhere; AREDS age related eye disease study; ARMD age related macular degeneration; POAG primary open angle glaucoma; EBMD epithelial/anterior basement membrane dystrophy; ACIOL anterior chamber intraocular lens; IOL intraocular lens; PCIOL posterior chamber intraocular lens; Phaco/IOL phacoemulsification with intraocular lens placement; PRK photorefractive keratectomy; LASIK laser assisted in situ keratomileusis; HTN hypertension; DM diabetes mellitus; COPD chronic obstructive pulmonary disease

## 2020-11-17 ENCOUNTER — Encounter (INDEPENDENT_AMBULATORY_CARE_PROVIDER_SITE_OTHER): Payer: Medicare HMO | Admitting: Ophthalmology

## 2020-11-24 ENCOUNTER — Ambulatory Visit: Payer: Medicare HMO | Admitting: Family Medicine

## 2020-11-28 NOTE — Progress Notes (Shared)
[facility] - Clinic Note  12/01/2020    d CHIEF COMPLAINT Patient presents for No chief complaint on file.   HISTORY OF PRESENT ILLNESS: Chelsey Carpenter is a 75 y.o. female who presents to the clinic today for:     Referring physician: Eliezer Lofts, MD [phone] [place],  [place] [address]  HISTORICAL INFORMATION:  Selected notes from the MEDICAL RECORD NUMBER Referred by Dr. Dion Body for retinal hemes LEE:  Ocular Hx- PMH-    CURRENT MEDICATIONS: No current outpatient medications on file. (Ophthalmic Drugs)   No current facility-administered medications for this visit. (Ophthalmic Drugs)   Current Outpatient Medications (Other)  Medication Sig   atorvastatin (LIPITOR) 40 MG tablet Take 40 mg by mouth daily.   chlorthalidone (HYGROTON) 25 MG tablet Take by mouth.   cloNIDine (CATAPRES) 0.1 MG tablet Take 0.1 mg by mouth 2 (two) times daily.   ezetimibe (ZETIA) 10 MG tablet Take 10 mg by mouth daily.   hydrochlorothiazide (HYDRODIURIL) 12.5 MG tablet Take 12.5 mg by mouth daily.   lisinopril (ZESTRIL) 20 MG tablet Take by mouth. (Patient not taking: Reported on 10/06/2020)   lisinopril (ZESTRIL) 30 MG tablet Take 30 mg by mouth daily.   lisinopril-hydrochlorothiazide (ZESTORETIC) 20-12.5 MG tablet Take by mouth.   Multiple Vitamin (MULTI-VITAMIN) tablet Take 1 tablet by mouth daily.   Multiple Vitamins-Minerals (MULTIVITAMIN WOMEN 50+) TABS Take 1 tablet by mouth daily.   potassium chloride (MICRO-K) 10 MEQ CR capsule Take by mouth.   ST JOSEPH LOW DOSE 81 MG EC tablet Take 81 mg by mouth daily. Swallow whole.   telmisartan (MICARDIS) 40 MG tablet Take 40 mg by mouth daily.   No current facility-administered medications for this visit. (Other)   REVIEW OF SYSTEMS:  ALLERGIES Allergies  Allergen Reactions   Amlodipine Other (See Comments)    Brought down B/P too fast   Other Other (See Comments)    Opioids- patient prefers to  not take this class of meds  Seasonal allergies- Runny nose, itchy eyes, post-nasal drip   Peanut-Containing Drug Products Nausea Only and Other (See Comments)    Made the patient sick to her stomach    PAST MEDICAL HISTORY Past Medical History:  Diagnosis Date   Cataract    Hypertension    Hypertensive retinopathy    Past Surgical History:  Procedure Laterality Date   ABDOMINAL HYSTERECTOMY     CYSTECTOMY     TONSILLECTOMY      FAMILY HISTORY No family history on file.  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 Proliferative retinopathy of right eye  H35.21     4. Retinal edema  H35.81     5. Essential hypertension  I10     6. Hypertensive retinopathy of both eyes  H35.033     7. Combined forms of age-related cataract of both eyes  H25.813  1-4. Eales vs Coats' Disease w/ proliferative retinopathy OD  - lost to f/u since initial visit on 7.1.22 (2+ mos) due to illness  - focal nonperfusion w/ sclerotic vessels, telangectasis, and terminal bulbs in temporal periphery on exam and FA  - FA 7.1.22 shows focal patch of capillary drop out / vascular nonperfusion w/ leaking terminal bulbs OD  - pt is asymptomatic  - BCVA 20/60 -- decreased  - discussed retinal findings  - recommend segmental PRP OD to areas of vascular nonperfusion, temporal periphery today, 10.10.22  - pt wishes to proceed with laser  - RBA of procedure discussed, questions answered - informed consent obtained and signed - see procedure note - start PF QID OD x7 days  - f/u 4-6 weeks  DFE, OCT  5,6. Hypertensive retinopathy OU - discussed importance of tight BP control - monitor  7. Mixed Cataract OU - The symptoms of cataract, surgical options, and treatments and risks were discussed with patient. - discussed diagnosis and progression - monitor  Ophthalmic Meds Ordered this visit:  No orders of the defined types were placed in this encounter.    No follow-ups on file.  There are no Patient Instructions on file for this visit.  This document serves as a record of services personally performed by Karie Chimera, MD, PhD. It was created on their behalf by Annalee Genta, COMT. The creation of this record is the provider's dictation and/or activities during the visit.  Electronically signed by: Annalee Genta, COMT 11/28/20 1:48 PM   Karie Chimera, M.D., Ph.D. Diseases & Surgery of the Retina and Vitreous [facility] 10/20/2020       Abbreviations: M myopia (nearsighted); A astigmatism; H hyperopia (farsighted); P presbyopia; Mrx spectacle prescription;  CTL contact lenses; OD right eye; OS left eye; OU both eyes  XT exotropia; ET esotropia; PEK punctate epithelial keratitis; PEE punctate epithelial erosions; DES dry eye syndrome; MGD meibomian gland dysfunction; ATs artificial tears; PFAT's preservative free artificial tears; NSC nuclear sclerotic cataract; PSC posterior subcapsular cataract; ERM epi-retinal membrane; PVD posterior vitreous detachment; RD retinal detachment; DM diabetes mellitus; DR diabetic retinopathy; NPDR non-proliferative diabetic retinopathy; PDR proliferative diabetic retinopathy; CSME clinically significant macular edema; DME diabetic macular edema; dbh dot blot hemorrhages; CWS cotton wool spot; POAG primary open angle glaucoma; C/D cup-to-disc ratio; HVF humphrey visual field; GVF goldmann visual field; OCT optical coherence tomography; IOP intraocular pressure; BRVO Branch retinal vein occlusion; CRVO central retinal  vein occlusion; CRAO central retinal artery occlusion; BRAO branch retinal artery occlusion; RT retinal tear; SB scleral buckle; PPV pars plana vitrectomy; VH Vitreous hemorrhage; PRP panretinal laser photocoagulation; IVK intravitreal kenalog; VMT vitreomacular traction; MH Macular hole;  NVD neovascularization of the disc; NVE neovascularization elsewhere; AREDS age related eye disease study; ARMD age related macular degeneration; POAG primary open angle glaucoma; EBMD epithelial/anterior basement membrane dystrophy; ACIOL anterior chamber intraocular lens; IOL intraocular lens; PCIOL posterior chamber intraocular lens; Phaco/IOL phacoemulsification with intraocular lens placement; PRK photorefractive keratectomy; LASIK laser assisted in situ keratomileusis; HTN hypertension; DM diabetes mellitus; COPD chronic obstructive pulmonary disease

## 2020-12-01 ENCOUNTER — Encounter (INDEPENDENT_AMBULATORY_CARE_PROVIDER_SITE_OTHER): Payer: Self-pay

## 2020-12-01 ENCOUNTER — Encounter (INDEPENDENT_AMBULATORY_CARE_PROVIDER_SITE_OTHER): Payer: Medicare HMO | Admitting: Ophthalmology

## 2020-12-01 DIAGNOSIS — I10 Essential (primary) hypertension: Secondary | ICD-10-CM

## 2020-12-01 DIAGNOSIS — H25813 Combined forms of age-related cataract, bilateral: Secondary | ICD-10-CM

## 2020-12-01 DIAGNOSIS — H3521 Other non-diabetic proliferative retinopathy, right eye: Secondary | ICD-10-CM

## 2020-12-01 DIAGNOSIS — H35021 Exudative retinopathy, right eye: Secondary | ICD-10-CM

## 2020-12-01 DIAGNOSIS — H35061 Retinal vasculitis, right eye: Secondary | ICD-10-CM

## 2020-12-01 DIAGNOSIS — H35033 Hypertensive retinopathy, bilateral: Secondary | ICD-10-CM

## 2020-12-01 DIAGNOSIS — H3581 Retinal edema: Secondary | ICD-10-CM

## 2020-12-22 NOTE — Progress Notes (Shared)
[facility] - Clinic Note  12/24/2020    d CHIEF COMPLAINT Patient presents for No chief complaint on file.   HISTORY OF PRESENT ILLNESS: Chelsey Carpenter is a 75 y.o. female who presents to the clinic today for:     Referring physician: Eliezer Lofts, MD [phone] [place],  [place] [address]  HISTORICAL INFORMATION:  Selected notes from the MEDICAL RECORD NUMBER Referred by Dr. Dion Body for retinal hemes LEE:  Ocular Hx- PMH-    CURRENT MEDICATIONS: No current outpatient medications on file. (Ophthalmic Drugs)   No current facility-administered medications for this visit. (Ophthalmic Drugs)   Current Outpatient Medications (Other)  Medication Sig   atorvastatin (LIPITOR) 40 MG tablet Take 40 mg by mouth daily.   chlorthalidone (HYGROTON) 25 MG tablet Take by mouth.   cloNIDine (CATAPRES) 0.1 MG tablet Take 0.1 mg by mouth 2 (two) times daily.   ezetimibe (ZETIA) 10 MG tablet Take 10 mg by mouth daily.   hydrochlorothiazide (HYDRODIURIL) 12.5 MG tablet Take 12.5 mg by mouth daily.   lisinopril (ZESTRIL) 20 MG tablet Take by mouth. (Patient not taking: Reported on 10/06/2020)   lisinopril (ZESTRIL) 30 MG tablet Take 30 mg by mouth daily.   lisinopril-hydrochlorothiazide (ZESTORETIC) 20-12.5 MG tablet Take by mouth.   Multiple Vitamin (MULTI-VITAMIN) tablet Take 1 tablet by mouth daily.   Multiple Vitamins-Minerals (MULTIVITAMIN WOMEN 50+) TABS Take 1 tablet by mouth daily.   potassium chloride (MICRO-K) 10 MEQ CR capsule Take by mouth.   ST JOSEPH LOW DOSE 81 MG EC tablet Take 81 mg by mouth daily. Swallow whole.   telmisartan (MICARDIS) 40 MG tablet Take 40 mg by mouth daily.   No current facility-administered medications for this visit. (Other)   REVIEW OF SYSTEMS:  ALLERGIES Allergies  Allergen Reactions   Amlodipine Other (See Comments)    Brought down B/P too fast   Other Other (See Comments)    Opioids- patient prefers to  not take this class of meds  Seasonal allergies- Runny nose, itchy eyes, post-nasal drip   Peanut-Containing Drug Products Nausea Only and Other (See Comments)    Made the patient sick to her stomach    PAST MEDICAL HISTORY Past Medical History:  Diagnosis Date   Cataract    Hypertension    Hypertensive retinopathy    Past Surgical History:  Procedure Laterality Date   ABDOMINAL HYSTERECTOMY     CYSTECTOMY     TONSILLECTOMY      FAMILY HISTORY No family history on file.  SOCIAL HISTORY Social History   Tobacco Use   Smoking status: Every Day   Smokeless tobacco: Never  Vaping Use   Vaping Use: Never used  Substance Use Topics   Alcohol use: Never   Drug use: Never       OPHTHALMIC EXAM:  Not recorded    IMAGING AND PROCEDURES  Imaging and Procedures for 12/24/2020          ASSESSMENT/PLAN:  No diagnosis found.  1-4. Eales vs Coats' Disease w/ proliferative retinopathy OD  - lost to f/u since initial visit on 7.1.22 (2+ mos) due to illness  - focal nonperfusion w/ sclerotic vessels, telangectasis, and terminal bulbs in temporal periphery on exam and FA  - FA 7.1.22 shows focal patch of capillary drop out / vascular nonperfusion w/ leaking terminal bulbs OD  - pt is asymptomatic  - BCVA 20/60 -- decreased  - discussed retinal findings  - recommend segmental PRP  OD to areas of vascular nonperfusion, temporal periphery today, 10.10.22  - pt wishes to proceed with laser  - RBA of procedure discussed, questions answered - informed consent obtained and signed - see procedure note - start PF QID OD x7 days  - f/u 4-6 weeks DFE, OCT  5,6. Hypertensive retinopathy OU - discussed importance of tight BP control - monitor  7. Mixed Cataract OU - The symptoms of cataract, surgical options, and treatments and risks were discussed with patient. - discussed diagnosis and progression - monitor  Ophthalmic Meds Ordered this visit:  No orders of the defined  types were placed in this encounter.    No follow-ups on file.  There are no Patient Instructions on file for this visit.  This document serves as a record of services personally performed by Karie Chimera, MD, PhD. It was created on their behalf by De Blanch, an ophthalmic technician. The creation of this record is the provider's dictation and/or activities during the visit.    Electronically signed by: De Blanch, OA, 12/22/20  3:23 PM   Karie Chimera, M.D., Ph.D. Diseases & Surgery of the Retina and Vitreous [facility] 10/20/2020    I have reviewed the above documentation for accuracy and completeness, and I agree with the above. Karie Chimera, M.D., Ph.D. 10/20/20 3:23 PM   Abbreviations: M myopia (nearsighted); A astigmatism; H hyperopia (farsighted); P presbyopia; Mrx spectacle prescription;  CTL contact lenses; OD right eye; OS left eye; OU both eyes  XT exotropia; ET esotropia; PEK punctate epithelial keratitis; PEE punctate epithelial erosions; DES dry eye syndrome; MGD meibomian gland dysfunction; ATs artificial tears; PFAT's preservative free artificial tears; NSC nuclear sclerotic cataract; PSC posterior subcapsular cataract; ERM epi-retinal membrane; PVD posterior vitreous detachment; RD retinal detachment; DM diabetes mellitus; DR diabetic retinopathy; NPDR non-proliferative diabetic retinopathy; PDR proliferative diabetic retinopathy; CSME clinically significant macular edema; DME diabetic macular edema; dbh dot blot hemorrhages; CWS cotton wool spot; POAG primary open angle glaucoma; C/D cup-to-disc ratio; HVF humphrey visual field; GVF goldmann visual field; OCT optical coherence tomography; IOP intraocular pressure; BRVO Branch retinal vein occlusion; CRVO central retinal vein occlusion; CRAO central retinal artery occlusion; BRAO branch retinal artery occlusion; RT retinal tear; SB scleral buckle; PPV pars plana vitrectomy; VH  Vitreous hemorrhage; PRP panretinal laser photocoagulation; IVK intravitreal kenalog; VMT vitreomacular traction; MH Macular hole;  NVD neovascularization of the disc; NVE neovascularization elsewhere; AREDS age related eye disease study; ARMD age related macular degeneration; POAG primary open angle glaucoma; EBMD epithelial/anterior basement membrane dystrophy; ACIOL anterior chamber intraocular lens; IOL intraocular lens; PCIOL posterior chamber intraocular lens; Phaco/IOL phacoemulsification with intraocular lens placement; PRK photorefractive keratectomy; LASIK laser assisted in situ keratomileusis; HTN hypertension; DM diabetes mellitus; COPD chronic obstructive pulmonary disease

## 2020-12-24 ENCOUNTER — Encounter (INDEPENDENT_AMBULATORY_CARE_PROVIDER_SITE_OTHER): Payer: Medicare HMO | Admitting: Ophthalmology

## 2020-12-24 DIAGNOSIS — I10 Essential (primary) hypertension: Secondary | ICD-10-CM

## 2020-12-24 DIAGNOSIS — H3581 Retinal edema: Secondary | ICD-10-CM

## 2020-12-24 DIAGNOSIS — H35021 Exudative retinopathy, right eye: Secondary | ICD-10-CM

## 2020-12-24 DIAGNOSIS — H35033 Hypertensive retinopathy, bilateral: Secondary | ICD-10-CM

## 2020-12-24 DIAGNOSIS — H35061 Retinal vasculitis, right eye: Secondary | ICD-10-CM

## 2020-12-24 DIAGNOSIS — H25813 Combined forms of age-related cataract, bilateral: Secondary | ICD-10-CM

## 2020-12-24 DIAGNOSIS — H3521 Other non-diabetic proliferative retinopathy, right eye: Secondary | ICD-10-CM

## 2021-03-10 ENCOUNTER — Ambulatory Visit: Payer: Medicare HMO | Admitting: Family Medicine

## 2022-09-05 ENCOUNTER — Emergency Department: Payer: Medicare HMO

## 2022-09-05 ENCOUNTER — Other Ambulatory Visit: Payer: Self-pay

## 2022-09-05 ENCOUNTER — Emergency Department
Admission: EM | Admit: 2022-09-05 | Discharge: 2022-09-05 | Disposition: A | Discharge status: Home / Self Care | Payer: Medicare HMO | Attending: Emergency Medicine | Admitting: Emergency Medicine

## 2022-09-05 DIAGNOSIS — I160 Hypertensive urgency: Secondary | ICD-10-CM | POA: Diagnosis not present

## 2022-09-05 DIAGNOSIS — J029 Acute pharyngitis, unspecified: Secondary | ICD-10-CM | POA: Diagnosis present

## 2022-09-05 DIAGNOSIS — J020 Streptococcal pharyngitis: Secondary | ICD-10-CM

## 2022-09-05 DIAGNOSIS — I10 Essential (primary) hypertension: Secondary | ICD-10-CM | POA: Insufficient documentation

## 2022-09-05 LAB — COMPREHENSIVE METABOLIC PANEL
ALT: 16 U/L (ref 0–44)
AST: 22 U/L (ref 15–41)
Albumin: 4 g/dL (ref 3.5–5.0)
Alkaline Phosphatase: 117 U/L (ref 38–126)
Anion gap: 8 (ref 5–15)
BUN: 9 mg/dL (ref 8–23)
CO2: 27 mmol/L (ref 22–32)
Calcium: 9.3 mg/dL (ref 8.9–10.3)
Chloride: 102 mmol/L (ref 98–111)
Creatinine, Ser: 1.02 mg/dL — ABNORMAL HIGH (ref 0.44–1.00)
GFR, Estimated: 57 mL/min — ABNORMAL LOW (ref >60)
Glucose, Bld: 93 mg/dL (ref 70–99)
Potassium: 3.6 mmol/L (ref 3.5–5.1)
Sodium: 137 mmol/L (ref 135–145)
Total Bilirubin: 1.3 mg/dL — ABNORMAL HIGH (ref 0.3–1.2)
Total Protein: 7.4 g/dL (ref 6.5–8.1)

## 2022-09-05 LAB — CBC WITH DIFFERENTIAL/PLATELET
Abs Immature Granulocytes: 0.01 10*3/uL (ref 0.00–0.07)
Basophils Absolute: 0.1 10*3/uL (ref 0.0–0.1)
Basophils Relative: 1 %
Eosinophils Absolute: 0.2 10*3/uL (ref 0.0–0.5)
Eosinophils Relative: 2 %
HCT: 40.9 % (ref 36.0–46.0)
Hemoglobin: 13.1 g/dL (ref 12.0–15.0)
Immature Granulocytes: 0 %
Lymphocytes Relative: 38 %
Lymphs Abs: 2.6 10*3/uL (ref 0.7–4.0)
MCH: 23.8 pg — ABNORMAL LOW (ref 26.0–34.0)
MCHC: 32 g/dL (ref 30.0–36.0)
MCV: 74.4 fL — ABNORMAL LOW (ref 80.0–100.0)
Monocytes Absolute: 0.6 10*3/uL (ref 0.1–1.0)
Monocytes Relative: 8 %
Neutro Abs: 3.4 10*3/uL (ref 1.7–7.7)
Neutrophils Relative %: 51 %
Platelets: 207 10*3/uL (ref 150–400)
RBC: 5.5 MIL/uL — ABNORMAL HIGH (ref 3.87–5.11)
RDW: 16.5 % — ABNORMAL HIGH (ref 11.5–15.5)
WBC: 6.7 10*3/uL (ref 4.0–10.5)
nRBC: 0 % (ref 0.0–0.2)

## 2022-09-05 MED ORDER — AMOXICILLIN 875 MG PO TABS: 875.0000 mg | ORAL_TABLET | Freq: Two times a day (BID) | ORAL | 0 refills | Status: AC

## 2022-09-05 MED ORDER — AMOXICILLIN 875 MG PO TABS: 875.0000 mg | ORAL_TABLET | Freq: Two times a day (BID) | ORAL | 0 refills | Status: DC

## 2022-09-05 MED ORDER — METOPROLOL TARTRATE 25 MG PO TABS: 25.0000 mg | ORAL_TABLET | Freq: Two times a day (BID) | ORAL | 0 refills | Status: AC | PRN

## 2022-09-05 MED ORDER — AMOXICILLIN 500 MG PO CAPS
1000.0000 mg | ORAL_CAPSULE | Freq: Once | ORAL | Status: AC
  Administered 2022-09-05: 1000 mg via ORAL
  Filled 2022-09-05: qty 2

## 2022-09-05 MED ORDER — METOPROLOL TARTRATE 25 MG PO TABS
25.0000 mg | ORAL_TABLET | Freq: Once | ORAL | Status: AC
  Administered 2022-09-05: 25 mg via ORAL
  Filled 2022-09-05: qty 1

## 2022-09-05 MED ORDER — CLONIDINE HCL 0.1 MG PO TABS
0.1000 mg | ORAL_TABLET | Freq: Once | ORAL | Status: AC
  Administered 2022-09-05: 0.1 mg via ORAL
  Filled 2022-09-05: qty 1

## 2022-09-05 MED ORDER — IOHEXOL 300 MG/ML  SOLN
75.0000 mL | Freq: Once | INTRAMUSCULAR | Status: AC | PRN
  Administered 2022-09-05: 75 mL via INTRAVENOUS

## 2022-09-05 MED ORDER — METOPROLOL TARTRATE 25 MG PO TABS: 25.0000 mg | ORAL_TABLET | Freq: Two times a day (BID) | ORAL | 0 refills | Status: DC | PRN

## 2022-09-05 NOTE — ED Triage Notes (Signed)
Patient was diagnosed with Strep throat today at PCP, was sent to ER for hypertension. Patient states she hasn't missed any doses of Lisinopril.

## 2022-09-05 NOTE — ED Provider Notes (Signed)
[facility] Provider Note  Patient Contact: 4:24 PM (approximate)   History   Hypertension   HPI  Chelsey Carpenter is a 77 y.o. female who presents emergency department for evaluation of sore throat and hypertension.  Patient states that she has had sore throat for the last 3 days, she has had voice changes and ultimately went to urgent care for evaluation.  They tested her for strep and she was positive.  While there her blood pressure was elevated at roughly 200 systolic.  Patient has a history of hypertension, is taking her lisinopril as directed but she states that her pressure typically is higher than it should be.  She typically runs 150s to 170s systolic.  Patient denies any headache, visual changes, fevers or chills, difficulty breathing or swallowing, chest pain, shortness of breath, abdominal pain.     Physical Exam   Triage Vital Signs: ED Triage Vitals  Encounter Vitals Group     BP 09/05/22 1603 (!) 212/122     Systolic BP Percentile --      Diastolic BP Percentile --      Pulse Rate 09/05/22 1603 (!) 111     Resp 09/05/22 1603 18     Temp 09/05/22 1603 99 F (37.2 C)     Temp Source 09/05/22 1603 Oral     SpO2 09/05/22 1603 100 %     Weight 09/05/22 1605 153 lb (69.4 kg)     Height 09/05/22 1605 5' (1.524 m)     Head Circumference --      Peak Flow --      Pain Score 09/05/22 1604 8     Pain Loc --      Pain Education --      Exclude from Growth Chart --     Most recent vital signs: Vitals:   09/05/22 1603 09/05/22 1724  BP: (!) 212/122 (!) 212/122  Pulse: (!) 111   Resp: 18   Temp: 99 F (37.2 C)   SpO2: 100%      General: Alert and in no acute distress. ENT:      Ears:       Nose: No congestion/rhinnorhea.      Mouth/Throat: Mucous membranes are moist.  Evaluation of the oropharynx reveals unequal oropharyngeal hypertrophy.  Patient does have a history of a tonsillectomy, appears that there has been some slight regrowth  of tonsillar tissue though not a full tonsil.  There is unequal tonsillar hypertrophy and voice changes at this time.  Uvula is deviated to the right. Neck: No stridor. No cervical spine tenderness to palpation.  Cardiovascular:  Good peripheral perfusion Respiratory: Normal respiratory effort without tachypnea or retractions. Lungs CTAB. Good air entry to the bases with no decreased or absent breath sounds. Musculoskeletal: Full range of motion to all extremities.  Neurologic:  No gross focal neurologic deficits are appreciated.  Skin:   No rash noted Other:   ED Results / Procedures / Treatments   Labs (all labs ordered are listed, but only abnormal results are displayed) Labs Reviewed  CBC WITH DIFFERENTIAL/PLATELET - Abnormal; Notable for the following components:      Result Value   RBC 5.50 (*)    MCV 74.4 (*)    MCH 23.8 (*)    RDW 16.5 (*)    All other components within normal limits  COMPREHENSIVE METABOLIC PANEL - Abnormal; Notable for the following components:   Creatinine, Ser 1.02 (*)    Total  Bilirubin 1.3 (*)    GFR, Estimated 57 (*)    All other components within normal limits     EKG     RADIOLOGY  I personally viewed, evaluated, and interpreted these images as part of my medical decision making, as well as reviewing the written report by the radiologist.  ED Provider Interpretation: No evidence of peritonsillar abscess, lingular tonsils are inflamed consistent with diagnosis of strep.  CT Soft Tissue Neck W Contrast  Result Date: 09/05/2022 CLINICAL DATA:  Strep throat, unequal hypertrophy to oropharynx, voice change EXAM: CT NECK WITH CONTRAST TECHNIQUE: Multidetector CT imaging of the neck was performed using the standard protocol following the bolus administration of intravenous contrast. RADIATION DOSE REDUCTION: This exam was performed according to the departmental dose-optimization program which includes automated exposure control, adjustment of the  mA and/or kV according to patient size and/or use of iterative reconstruction technique. CONTRAST:  75mL OMNIPAQUE IOHEXOL 300 MG/ML  SOLN COMPARISON:  None Available. FINDINGS: Pharynx and larynx: Prominence of the lingual tonsils, with mild mucosal edema in the pharynx. No low-density collection to suggest abscess or phlegmon. The larynx is unremarkable. Salivary glands: No inflammation, mass, or stone. Thyroid: Normal. Lymph nodes: None enlarged or abnormal density. Vascular: Patent. Limited intracranial: Negative. Visualized orbits: Negative. Mastoids and visualized paranasal sinuses: Mucosal thickening in the ethmoid air cells. Otherwise clear paranasal sinuses. Partially imaged mastoids appear clear. Skeleton: No acute osseous abnormality. Upper chest: No focal pulmonary opacity or pleural effusion. Other: None. IMPRESSION: Prominence of the lingual tonsils, with mild mucosal edema in the pharynx, consistent with tonsillitis/pharyngitis. No low-density collection to suggest abscess or phlegmon. Electronically Signed   By: Wiliam Ke M.D.   On: 09/05/2022 19:30    PROCEDURES:  Critical Care performed: No  Procedures   MEDICATIONS ORDERED IN ED: Medications  amoxicillin (AMOXIL) capsule 1,000 mg (has no administration in time range)  cloNIDine (CATAPRES) tablet 0.1 mg (0.1 mg Oral Given 09/05/22 1724)  iohexol (OMNIPAQUE) 300 MG/ML solution 75 mL (75 mLs Intravenous Contrast Given 09/05/22 1816)     IMPRESSION / MDM / ASSESSMENT AND PLAN / ED COURSE  I reviewed the triage vital signs and the nursing notes.                                 Differential diagnosis includes, but is not limited to, strep pharyngitis, peritonsillar abscess, retropharyngeal abscess, hypertension, hypertensive urgency, hypertensive emergency.   Patient's presentation is most consistent with acute presentation with potential threat to life or bodily function.   Patient's diagnosis is consistent with strep  pharyngitis, hypertensive urgency.  Patient presents the emergency department with sore throat and elevated blood pressure readings.  Patient states that she does take lisinopril daily but her blood pressures have been creeping up.  She is averaging it appears anywhere from the 150s to the 170s systolic.  Patient went to urgent care today for her sore throat, diagnosed with strep based off of swab and it was noted that her blood pressure was elevated.  Patient arrived with a pressure of 212/122.  She was given clonidine which did decrease her pressures.  Labs are reassuring.  Patient has no other signs of endorgan injury.  At this time I suspect that she has a component of hypertension that is needing additional medication but given the fact that she is acutely ill at the time with pressures I do recommend keeping a blood  pressure journal as we treat the infection to determine where her true pressure averages.  I will prescribe metoprolol as a rescue medication if her blood pressure is trending above 185 systolic.  Concerning signs and symptoms such as headache, unilateral weakness, facial droop, slurred speech, altered mental status, chest pain should return to the ED.  Otherwise follow-up with primary care for ongoing management of her blood pressure.  Patient and daughter are agreeable with this plan.. Patient is given ED precautions to return to the ED for any worsening or new symptoms.     FINAL CLINICAL IMPRESSION(S) / ED DIAGNOSES   Final diagnoses:  Hypertensive urgency  Strep pharyngitis     Rx / DC Orders   ED Discharge Orders          Ordered    metoprolol tartrate (LOPRESSOR) 25 MG tablet  2 times daily PRN        09/05/22 1958    amoxicillin (AMOXIL) 875 MG tablet  2 times daily        09/05/22 1958             Note:  This document was prepared using Dragon voice recognition software and may include unintentional dictation errors.   Lanette Hampshire 09/05/22  1958    Jene Every, MD 09/08/22 [phone]

## 2023-01-17 IMAGING — CT CT ANGIO CHEST
3 of 7 series · 18 of 46 positions shown · IV contrast (omnipaque)
Comparison: Chest radiograph earlier today.

CLINICAL DATA: Chest pain or back pain, aortic dissection suspected

EXAM:
CT ANGIOGRAPHY CHEST WITH CONTRAST
TECHNIQUE: Multidetector CT imaging of the chest was performed using the
standard protocol during bolus administration of intravenous
contrast. Multiplanar CT image reconstructions and MIPs were
obtained to evaluate the vascular anatomy.
CONTRAST:  100mL OMNIPAQUE IOHEXOL 350 MG/ML SOLN

[Series 6: axial arterial · axial · arterial · 0.86mm/px · z∈[+1348,+1594]mm · 9 of 104 slices shown]
[im 11/104  lung]
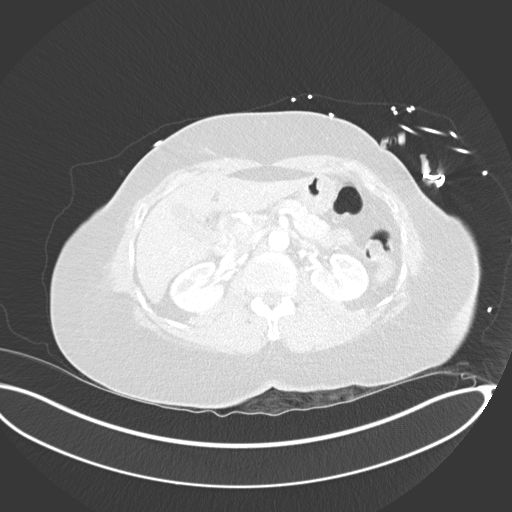
[im 21/104  soft-tissue]
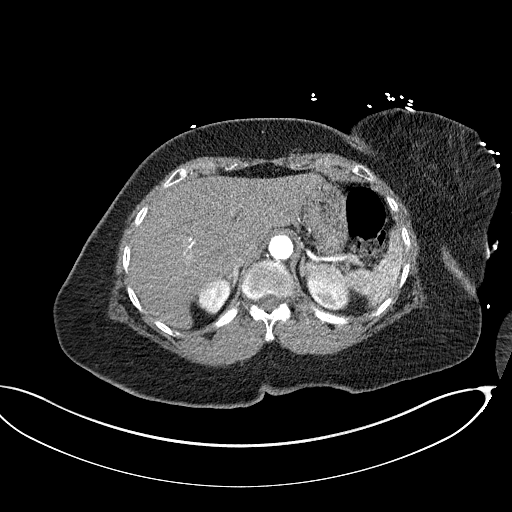
[im 31/104  lung]
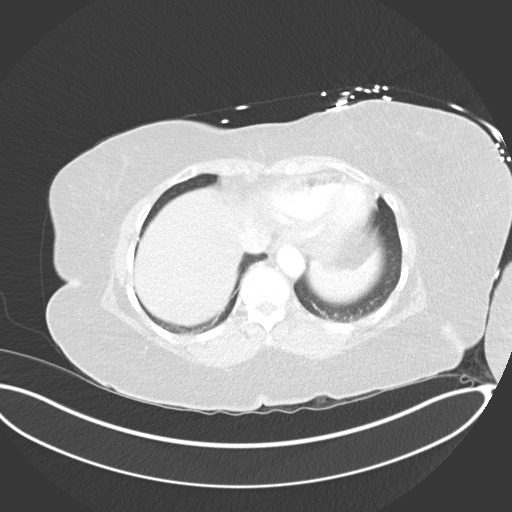
[im 42/104  soft-tissue]
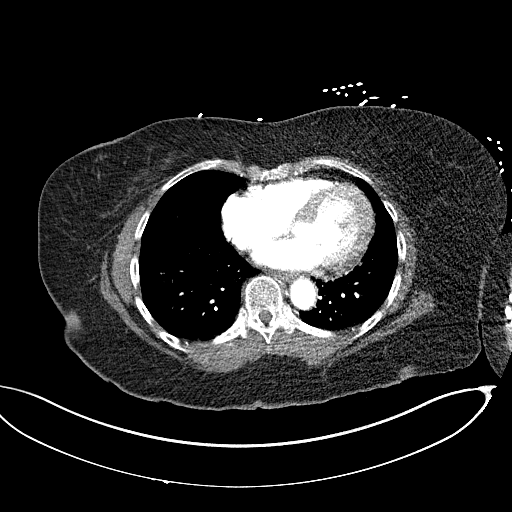
[im 52/104  lung]
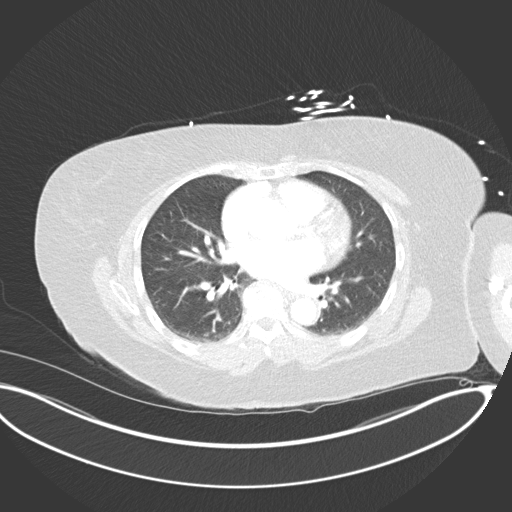
[im 62/104  soft-tissue]
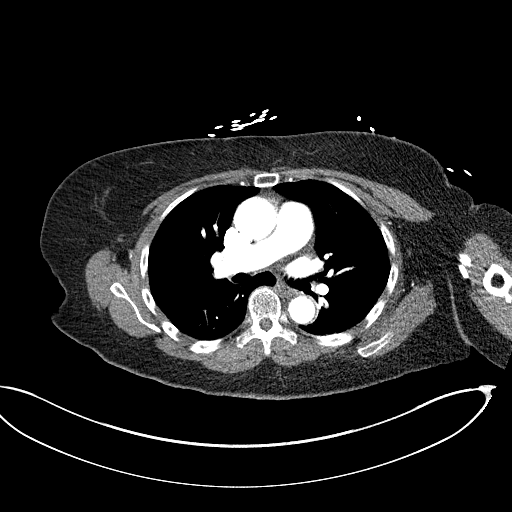
[im 73/104  lung]
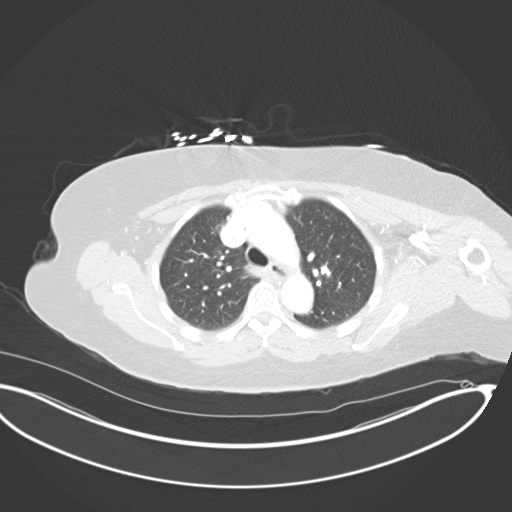
[im 83/104  soft-tissue]
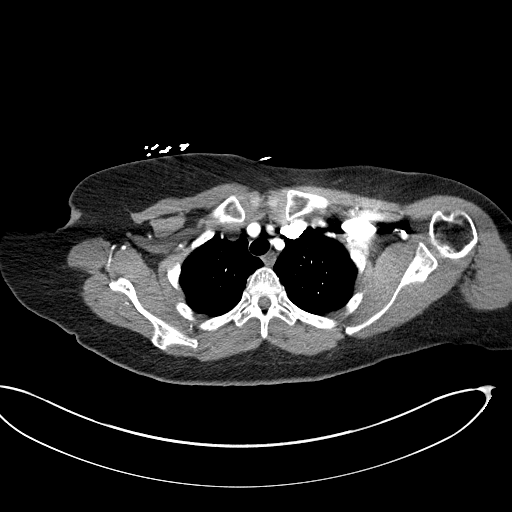
[im 93/104  lung]
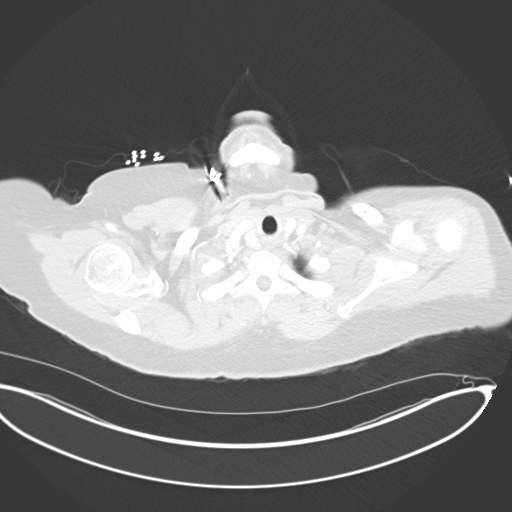

[Series 7: coronals · coronal · 0.65mm/px · 3 of 139 slices shown]
[im 35/139  soft-tissue]
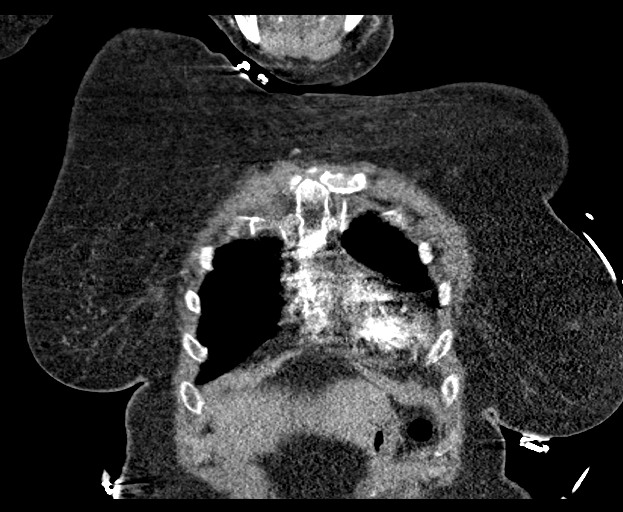
[im 70/139  soft-tissue]
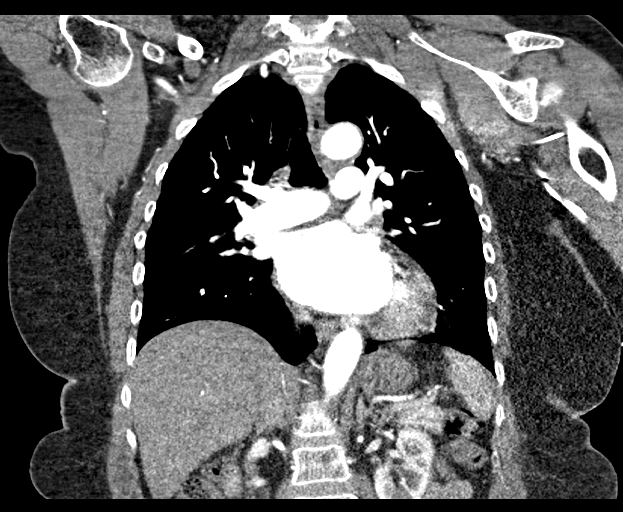
[im 104/139  soft-tissue]
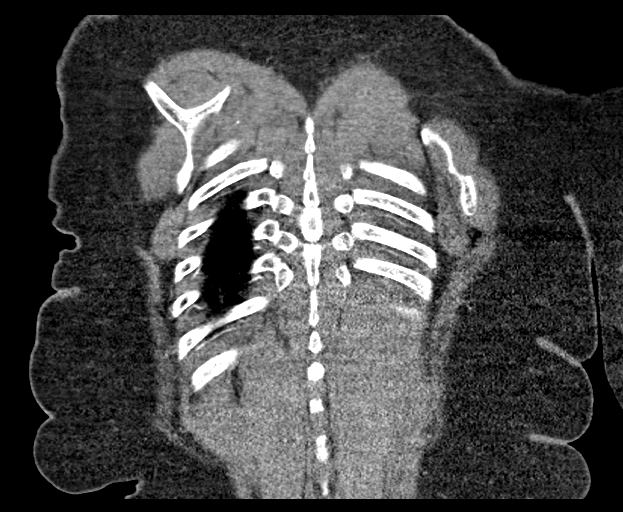

[Series 12: lung · axial · 0.86mm/px · z∈[+1388,+1546]mm · 6 of 129 slices shown]
[im 10/129  soft-tissue]
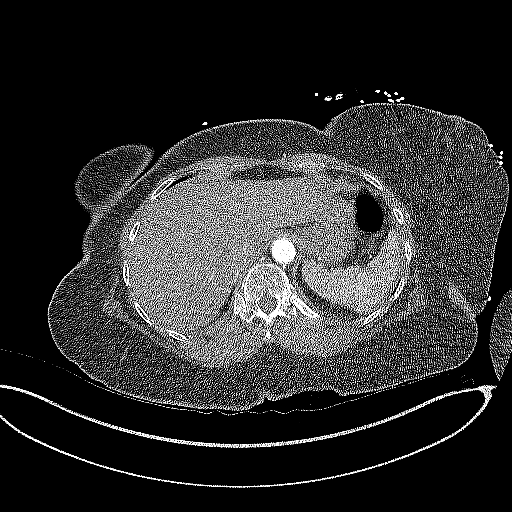
[im 30/129  soft-tissue]
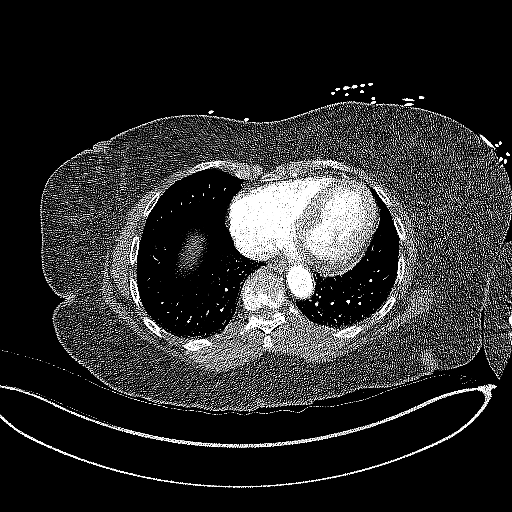
[im 40/129  soft-tissue]
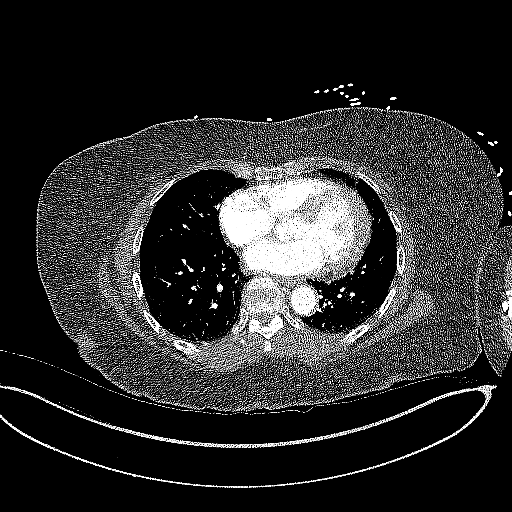
[im 60/129  soft-tissue]
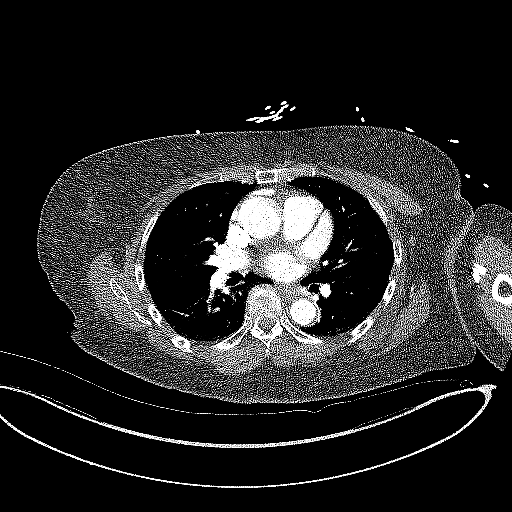
[im 69/129  soft-tissue]
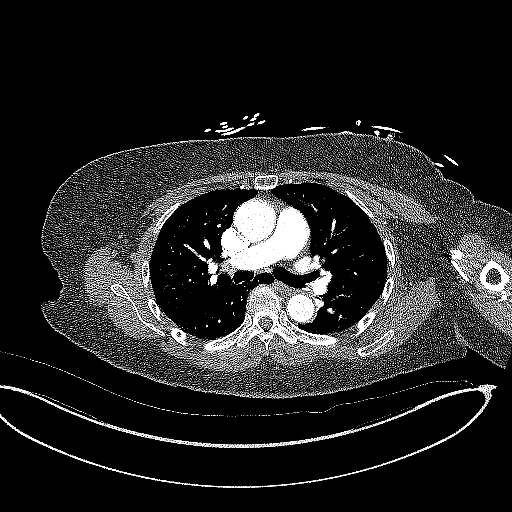
[im 89/129  soft-tissue]
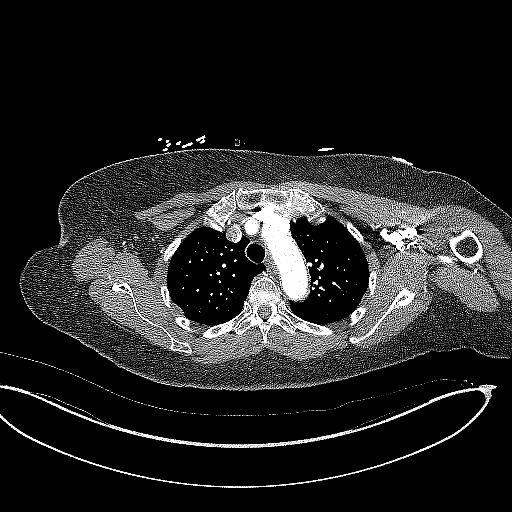

[18 of 46 positions shown; findings below may reference images not displayed]

FINDINGS: Cardiovascular: No aortic hematoma noncontrast exam. Thoracic aorta
is normal in caliber without dissection, vasculitis, or evidence of
acute aortic syndrome. Conventional branching pattern from the
aortic arch. Aortic branch vessels are patent. Portions of the left
subclavian artery are obscured by dense IV contrast in the
subclavian and axillary vein. There is no pulmonary embolus to the
segmental level. Heart is normal in size. Coronary artery
calcifications or stent. No pericardial effusion.

Mediastinum/Nodes: No enlarged mediastinal or hilar lymph nodes.
Decompressed esophagus. No thyroid nodule.

Lungs/Pleura: Subsegmental atelectasis in both lower lobes. Punctate
calcified granuloma in the left lower lobe. No confluent
consolidation. No pleural fluid. No findings of pulmonary edema. No
pulmonary mass or noncalcified nodule. Trachea and central bronchi
are patent.

Upper Abdomen: No acute or unexpected findings.

Musculoskeletal: Bones under mineralized. There are no acute or
suspicious osseous abnormalities.

Review of the MIP images confirms the above findings.
IMPRESSION: 1. No aortic dissection or acute aortic abnormality. No pulmonary
embolus.
2. Subsegmental atelectasis in both lower lobes.
3. Coronary artery calcifications or stent.

## 2023-03-26 ENCOUNTER — Other Ambulatory Visit: Payer: Self-pay

## 2023-03-26 ENCOUNTER — Emergency Department

## 2023-03-26 ENCOUNTER — Emergency Department
Admission: EM | Admit: 2023-03-26 | Discharge: 2023-03-26 | Disposition: A | Discharge status: Home / Self Care | Attending: Emergency Medicine | Admitting: Emergency Medicine

## 2023-03-26 DIAGNOSIS — R42 Dizziness and giddiness: Secondary | ICD-10-CM

## 2023-03-26 DIAGNOSIS — I10 Essential (primary) hypertension: Secondary | ICD-10-CM | POA: Insufficient documentation

## 2023-03-26 DIAGNOSIS — I160 Hypertensive urgency: Secondary | ICD-10-CM | POA: Diagnosis not present

## 2023-03-26 LAB — COMPREHENSIVE METABOLIC PANEL
ALT: 19 U/L (ref 0–44)
AST: 29 U/L (ref 15–41)
Albumin: 3.7 g/dL (ref 3.5–5.0)
Alkaline Phosphatase: 75 U/L (ref 38–126)
Anion gap: 8 (ref 5–15)
BUN: 10 mg/dL (ref 8–23)
CO2: 26 mmol/L (ref 22–32)
Calcium: 9.2 mg/dL (ref 8.9–10.3)
Chloride: 105 mmol/L (ref 98–111)
Creatinine, Ser: 1.05 mg/dL — ABNORMAL HIGH (ref 0.44–1.00)
GFR, Estimated: 54 mL/min — ABNORMAL LOW (ref >60)
Glucose, Bld: 98 mg/dL (ref 70–99)
Potassium: 3.6 mmol/L (ref 3.5–5.1)
Sodium: 139 mmol/L (ref 135–145)
Total Bilirubin: 1 mg/dL (ref 0.0–1.2)
Total Protein: 6.8 g/dL (ref 6.5–8.1)

## 2023-03-26 LAB — TROPONIN I (HIGH SENSITIVITY): Troponin I (High Sensitivity): 2 ng/L (ref <18)

## 2023-03-26 LAB — CBC WITH DIFFERENTIAL/PLATELET
Abs Immature Granulocytes: 0.01 10*3/uL (ref 0.00–0.07)
Basophils Absolute: 0.1 10*3/uL (ref 0.0–0.1)
Basophils Relative: 1 %
Eosinophils Absolute: 0.3 10*3/uL (ref 0.0–0.5)
Eosinophils Relative: 4 %
HCT: 39.9 % (ref 36.0–46.0)
Hemoglobin: 12.9 g/dL (ref 12.0–15.0)
Immature Granulocytes: 0 %
Lymphocytes Relative: 65 %
Lymphs Abs: 4.4 10*3/uL — ABNORMAL HIGH (ref 0.7–4.0)
MCH: 24.3 pg — ABNORMAL LOW (ref 26.0–34.0)
MCHC: 32.3 g/dL (ref 30.0–36.0)
MCV: 75.1 fL — ABNORMAL LOW (ref 80.0–100.0)
Monocytes Absolute: 0.6 10*3/uL (ref 0.1–1.0)
Monocytes Relative: 9 %
Neutro Abs: 1.4 10*3/uL — ABNORMAL LOW (ref 1.7–7.7)
Neutrophils Relative %: 21 %
Platelets: 203 10*3/uL (ref 150–400)
RBC: 5.31 MIL/uL — ABNORMAL HIGH (ref 3.87–5.11)
RDW: 17.4 % — ABNORMAL HIGH (ref 11.5–15.5)
WBC: 6.7 10*3/uL (ref 4.0–10.5)
nRBC: 0 % (ref 0.0–0.2)

## 2023-03-26 MED ORDER — LABETALOL HCL 5 MG/ML IV SOLN
10.0000 mg | Freq: Once | INTRAVENOUS | Status: AC
  Administered 2023-03-26: 10 mg via INTRAVENOUS
  Filled 2023-03-26: qty 4

## 2023-03-26 MED ORDER — CLONIDINE HCL 0.1 MG PO TABS
0.1000 mg | ORAL_TABLET | Freq: Once | ORAL | Status: AC
  Administered 2023-03-26: 0.1 mg via ORAL
  Filled 2023-03-26: qty 1

## 2023-03-26 NOTE — ED Triage Notes (Addendum)
 Pt to ED via POV for hypertension.  Pt states she felt "sort of disoriented" while reading.  She checked her BP and it was 219/139 prompting her to come to the ED.  Pt took 20 mg of Lisinopril at 0100.  Pt is alert, oriented, and self ambulatory at this time.

## 2023-03-26 NOTE — ED Notes (Signed)
 Pt reports coming to ED d/t "feeling disoriented." Pt states she took her BP meds early so thought her BP would be low. Pt denies headache, dizziness, lightheadedness, vision changes, numbness/tingling in extremities. AAOx4

## 2023-03-26 NOTE — Discharge Instructions (Signed)
 You were seen in the ER today for evaluation of your dizziness and elevated blood pressure.  Your testing here fortunately did not show an emergency cause for this.  Please make sure you are taking your blood pressure medication as directed.  Keep a log of your blood pressure and take this into your primary care doctor so that they can adjust your medications.  Contact your primary care doctor to arrange close follow-up.  Return to the ER for new or worsening symptoms including headache, chest pain, shortness of breath, or any other new or concerning symptoms.

## 2023-03-26 NOTE — ED Provider Notes (Signed)
 [facility] Provider Note    Event Date/Time   First MD Initiated Contact with Patient 03/26/23 [phone]     (approximate)   History   Hypertension   HPI  Chelsey Carpenter is a 78 year old female with history of hypertension presenting to the emergency department for evaluation of dizziness and elevated blood pressure.  Patient woke up around 1 AM this morning.  She read the clock around and thought it was time to take her morning lisinopril.  Shortly after, she felt "woozy" so she checked her blood pressure and noted it was significantly elevated with systolics in the 200s.  Unsure what her blood pressure usually runs, but does not think it is this high.  In the setting of this, presents to the ER.  Denies chest pain, shortness of breath.  Dizziness has improved.  No numbness, tingling, focal weakness.  Denies headache.     Physical Exam   Triage Vital Signs: ED Triage Vitals  Encounter Vitals Group     BP 03/26/23 0304 (!) 263/125     Systolic BP Percentile --      Diastolic BP Percentile --      Pulse Rate 03/26/23 0304 91     Resp 03/26/23 0304 17     Temp 03/26/23 0304 98.2 F (36.8 C)     Temp Source 03/26/23 0304 Oral     SpO2 03/26/23 0304 99 %     Weight 03/26/23 0312 150 lb (68 kg)     Height 03/26/23 0312 5' (1.524 m)     Head Circumference --      Peak Flow --      Pain Score 03/26/23 0312 0     Pain Loc --      Pain Education --      Exclude from Growth Chart --     Most recent vital signs: Vitals:   03/26/23 0420 03/26/23 0515  BP: (!) 217/95 (!) 192/84  Pulse: 74 69  Resp: (!) 22 13  Temp:    SpO2: 100% 99%     General: Awake, interactive  Head:  Atraumatic CV:  Regular rate, good peripheral perfusion.  Resp:  Unlabored respirations, lungs good auscultation Abd:  Nondistended, soft, nontender Neuro:  Symmetric facial movement, fluid speech, 5 out of 5 strength in the bilateral upper and lower extremities with normal  sensation, normal finger-to-nose testing   ED Results / Procedures / Treatments   Labs (all labs ordered are listed, but only abnormal results are displayed) Labs Reviewed  CBC WITH DIFFERENTIAL/PLATELET - Abnormal; Notable for the following components:      Result Value   RBC 5.31 (*)    MCV 75.1 (*)    MCH 24.3 (*)    RDW 17.4 (*)    Neutro Abs 1.4 (*)    Lymphs Abs 4.4 (*)    All other components within normal limits  COMPREHENSIVE METABOLIC PANEL - Abnormal; Notable for the following components:   Creatinine, Ser 1.05 (*)    GFR, Estimated 54 (*)    All other components within normal limits  TROPONIN I (HIGH SENSITIVITY)     EKG EKG independently reviewed interpreted by myself (ER attending) demonstrates:  EKG demonstrates sinus tachycardia at a rate of 107, PR 69, QRS 114, QTc 448, no acute ST changes  RADIOLOGY Imaging independently reviewed and interpreted by myself demonstrates:  CT head without acute bleed   PROCEDURES:  Critical Care performed: No  Procedures  MEDICATIONS ORDERED IN ED: Medications  labetalol (NORMODYNE) injection 10 mg (10 mg Intravenous Given 03/26/23 0337)  cloNIDine (CATAPRES) tablet 0.1 mg (0.1 mg Oral Given 03/26/23 0446)     IMPRESSION / MDM / ASSESSMENT AND PLAN / ED COURSE  I reviewed the triage vital signs and the nursing notes.  Differential diagnosis includes, but is not limited to, hypertensive urgency, consider patient for hypertensive emergency including intracranial bleed though overall lower suspicion based on presentation  Patient's presentation is most consistent with acute presentation with potential threat to life or bodily function.  78 year old female presenting with dizziness and elevated blood pressure.  Significantly hypertensive on presentation.  Slightly improved on repeat.  Given labetalol without significant benefit.  Will trial oral clonidine.  Labs without critical derangement.  Negative troponin.  CT head  without acute bleed.   5:19 AM Patient reassessed.  Remains feeling improved.  Blood pressure improved to systolics in the 190s.  No evidence of hypertensive emergency.  Did discuss discharge with blood pressure log and close outpatient follow with her primary care doctor for further blood pressure medication adjustments.  Patient is comfortable this plan.  Strict return precautions provided.  Patient discharged stable condition.      FINAL CLINICAL IMPRESSION(S) / ED DIAGNOSES   Final diagnoses:  Hypertensive urgency  Dizziness     Rx / DC Orders   ED Discharge Orders     None        Note:  This document was prepared using Dragon voice recognition software and may include unintentional dictation errors.   Trinna Post, MD 03/26/23 [phone]
